# Patient Record
Sex: Female | Born: 1935 | Race: White | Hispanic: No | Smoking: Never smoker
Health system: Southern US, Community
[De-identification: ages and names within clinical notes are randomized; demographics above are authoritative.]

## PROBLEM LIST (undated history)

## (undated) DIAGNOSIS — M1990 Unspecified osteoarthritis, unspecified site: Secondary | ICD-10-CM

## (undated) DIAGNOSIS — I639 Cerebral infarction, unspecified: Secondary | ICD-10-CM

## (undated) HISTORY — PX: BACK SURGERY: SHX140

## (undated) HISTORY — PX: MASTOIDECTOMY: SUR855

## (undated) HISTORY — PX: CATARACT EXTRACTION BILATERAL W/ ANTERIOR VITRECTOMY: SHX1304

## (undated) HISTORY — PX: ABDOMINAL HYSTERECTOMY: SHX81

## (undated) HISTORY — PX: VARICOSE VEIN SURGERY: SHX832

## (undated) HISTORY — PX: FRACTURE SURGERY: SHX138

## (undated) HISTORY — PX: KNEE SURGERY: SHX244

## (undated) HISTORY — PX: NECK DISSECTION: SUR422

---

## 2001-06-26 ENCOUNTER — Encounter: Payer: Self-pay | Admitting: Neurosurgery

## 2001-07-01 ENCOUNTER — Inpatient Hospital Stay (HOSPITAL_COMMUNITY): Admission: RE | Admit: 2001-07-01 | Discharge: 2001-07-05 | Payer: Self-pay | Admitting: Neurosurgery

## 2001-07-01 ENCOUNTER — Encounter: Payer: Self-pay | Admitting: Neurosurgery

## 2001-07-02 ENCOUNTER — Encounter: Payer: Self-pay | Admitting: Neurosurgery

## 2005-04-03 ENCOUNTER — Ambulatory Visit: Payer: Self-pay | Admitting: Unknown Physician Specialty

## 2006-09-03 ENCOUNTER — Ambulatory Visit: Payer: Self-pay | Admitting: Family Medicine

## 2006-09-10 ENCOUNTER — Encounter: Payer: Self-pay | Admitting: Otolaryngology

## 2006-09-28 ENCOUNTER — Encounter: Payer: Self-pay | Admitting: Otolaryngology

## 2006-12-18 ENCOUNTER — Ambulatory Visit: Payer: Self-pay | Admitting: Otolaryngology

## 2006-12-27 ENCOUNTER — Emergency Department: Payer: Self-pay | Admitting: Emergency Medicine

## 2006-12-27 ENCOUNTER — Other Ambulatory Visit: Payer: Self-pay

## 2007-04-16 ENCOUNTER — Ambulatory Visit: Payer: Self-pay | Admitting: Gastroenterology

## 2008-02-24 ENCOUNTER — Ambulatory Visit: Payer: Self-pay

## 2008-03-18 ENCOUNTER — Other Ambulatory Visit: Payer: Self-pay

## 2008-03-18 ENCOUNTER — Ambulatory Visit: Payer: Self-pay | Admitting: Specialist

## 2008-03-25 ENCOUNTER — Ambulatory Visit: Payer: Self-pay | Admitting: Specialist

## 2010-02-01 ENCOUNTER — Ambulatory Visit: Payer: Self-pay | Admitting: Family Medicine

## 2010-08-14 ENCOUNTER — Emergency Department: Payer: Self-pay | Admitting: Emergency Medicine

## 2011-09-16 ENCOUNTER — Observation Stay: Payer: Self-pay | Admitting: Internal Medicine

## 2011-09-17 DIAGNOSIS — R079 Chest pain, unspecified: Secondary | ICD-10-CM

## 2013-03-17 ENCOUNTER — Emergency Department: Payer: Self-pay | Admitting: Unknown Physician Specialty

## 2013-04-14 ENCOUNTER — Ambulatory Visit: Payer: Self-pay | Admitting: Family Medicine

## 2013-07-11 ENCOUNTER — Emergency Department: Payer: Self-pay | Admitting: Emergency Medicine

## 2013-07-24 ENCOUNTER — Ambulatory Visit: Payer: Self-pay | Admitting: Specialist

## 2014-06-01 LAB — BASIC METABOLIC PANEL
Anion Gap: 8 (ref 7–16)
BUN: 10 mg/dL (ref 7–18)
Calcium, Total: 8.8 mg/dL (ref 8.5–10.1)
Chloride: 101 mmol/L (ref 98–107)
Co2: 27 mmol/L (ref 21–32)
Creatinine: 1.07 mg/dL (ref 0.60–1.30)
EGFR (African American): 51 — ABNORMAL LOW
EGFR (Non-African Amer.): 44 — ABNORMAL LOW
Glucose: 119 mg/dL — ABNORMAL HIGH (ref 65–99)
Osmolality: 272 (ref 275–301)
Potassium: 3.9 mmol/L (ref 3.5–5.1)
Sodium: 136 mmol/L (ref 136–145)

## 2014-06-01 LAB — CBC
HCT: 38.5 % (ref 35.0–47.0)
HGB: 12.9 g/dL (ref 12.0–16.0)
MCH: 31.9 pg (ref 26.0–34.0)
MCHC: 33.7 g/dL (ref 32.0–36.0)
MCV: 95 fL (ref 80–100)
Platelet: 296 10*3/uL (ref 150–440)
RBC: 4.05 10*6/uL (ref 3.80–5.20)
RDW: 14.1 % (ref 11.5–14.5)
WBC: 23 10*3/uL — ABNORMAL HIGH (ref 3.6–11.0)

## 2014-06-01 LAB — TROPONIN I: Troponin-I: <0.02 ng/mL

## 2014-06-02 ENCOUNTER — Inpatient Hospital Stay: Payer: Self-pay | Admitting: Internal Medicine

## 2014-06-02 LAB — URINALYSIS, COMPLETE
Bilirubin,UR: NEGATIVE
Blood: NEGATIVE
Glucose,UR: NEGATIVE mg/dL (ref 0–75)
Hyaline Cast: 2
Ketone: NEGATIVE
Leukocyte Esterase: NEGATIVE
Nitrite: NEGATIVE
Ph: 5 (ref 4.5–8.0)
Protein: NEGATIVE
RBC,UR: 2 /HPF (ref 0–5)
Specific Gravity: 1.01 (ref 1.003–1.030)
Squamous Epithelial: <1
WBC UR: <1 /HPF (ref 0–5)

## 2014-06-02 LAB — HEMOGLOBIN: HGB: 12.2 g/dL (ref 12.0–16.0)

## 2014-06-02 LAB — BASIC METABOLIC PANEL
Anion Gap: 6 — ABNORMAL LOW (ref 7–16)
BUN: 10 mg/dL (ref 7–18)
Calcium, Total: 8.6 mg/dL (ref 8.5–10.1)
Chloride: 102 mmol/L (ref 98–107)
Co2: 28 mmol/L (ref 21–32)
Creatinine: 1.08 mg/dL (ref 0.60–1.30)
EGFR (African American): 51 — ABNORMAL LOW
EGFR (Non-African Amer.): 44 — ABNORMAL LOW
Glucose: 158 mg/dL — ABNORMAL HIGH (ref 65–99)
Osmolality: 274 (ref 275–301)
Potassium: 4.1 mmol/L (ref 3.5–5.1)
Sodium: 136 mmol/L (ref 136–145)

## 2014-06-03 LAB — BASIC METABOLIC PANEL
Anion Gap: 6 — ABNORMAL LOW (ref 7–16)
BUN: 13 mg/dL (ref 7–18)
Calcium, Total: 8.4 mg/dL — ABNORMAL LOW (ref 8.5–10.1)
Chloride: 99 mmol/L (ref 98–107)
Co2: 30 mmol/L (ref 21–32)
Creatinine: 1.28 mg/dL (ref 0.60–1.30)
EGFR (Non-African Amer.): 36 — ABNORMAL LOW
GFR CALC AF AMER: 41 — AB
Glucose: 113 mg/dL — ABNORMAL HIGH (ref 65–99)
Osmolality: 271 (ref 275–301)
POTASSIUM: 4.6 mmol/L (ref 3.5–5.1)
Sodium: 135 mmol/L — ABNORMAL LOW (ref 136–145)

## 2014-06-03 LAB — CBC WITH DIFFERENTIAL/PLATELET
Basophil #: 0 10*3/uL (ref 0.0–0.1)
Basophil %: 0.2 %
Eosinophil #: 0 10*3/uL (ref 0.0–0.7)
Eosinophil %: 0.1 %
HCT: 28.4 % — ABNORMAL LOW (ref 35.0–47.0)
HGB: 9.5 g/dL — ABNORMAL LOW (ref 12.0–16.0)
Lymphocyte #: 0.9 10*3/uL — ABNORMAL LOW (ref 1.0–3.6)
Lymphocyte %: 8 %
MCH: 32.2 pg (ref 26.0–34.0)
MCHC: 33.5 g/dL (ref 32.0–36.0)
MCV: 96 fL (ref 80–100)
Monocyte #: 1.4 x10 3/mm — ABNORMAL HIGH (ref 0.2–0.9)
Monocyte %: 12.9 %
Neutrophil #: 8.5 10*3/uL — ABNORMAL HIGH (ref 1.4–6.5)
Neutrophil %: 78.8 %
Platelet: 239 10*3/uL (ref 150–440)
RBC: 2.95 10*6/uL — ABNORMAL LOW (ref 3.80–5.20)
RDW: 14.1 % (ref 11.5–14.5)
WBC: 10.7 10*3/uL (ref 3.6–11.0)

## 2014-06-04 LAB — BASIC METABOLIC PANEL
Anion Gap: 4 — ABNORMAL LOW (ref 7–16)
BUN: 18 mg/dL (ref 7–18)
CO2: 29 mmol/L (ref 21–32)
CREATININE: 1.16 mg/dL (ref 0.60–1.30)
Calcium, Total: 7.5 mg/dL — ABNORMAL LOW (ref 8.5–10.1)
Chloride: 100 mmol/L (ref 98–107)
EGFR (African American): 47 — ABNORMAL LOW
GFR CALC NON AF AMER: 40 — AB
Glucose: 87 mg/dL (ref 65–99)
Osmolality: 268 (ref 275–301)
Potassium: 4.1 mmol/L (ref 3.5–5.1)
SODIUM: 133 mmol/L — AB (ref 136–145)

## 2014-06-04 LAB — HEMOGLOBIN: HGB: 7.7 g/dL — ABNORMAL LOW (ref 12.0–16.0)

## 2014-06-05 LAB — HEMOGLOBIN: HGB: 9.3 g/dL — AB (ref 12.0–16.0)

## 2014-06-09 ENCOUNTER — Inpatient Hospital Stay: Payer: Self-pay | Admitting: Internal Medicine

## 2014-06-09 DIAGNOSIS — R0989 Other specified symptoms and signs involving the circulatory and respiratory systems: Secondary | ICD-10-CM

## 2014-06-09 DIAGNOSIS — R5383 Other fatigue: Secondary | ICD-10-CM

## 2014-06-09 DIAGNOSIS — R000 Tachycardia, unspecified: Secondary | ICD-10-CM

## 2014-06-09 DIAGNOSIS — R0609 Other forms of dyspnea: Secondary | ICD-10-CM

## 2014-06-09 DIAGNOSIS — R5381 Other malaise: Secondary | ICD-10-CM

## 2014-06-09 LAB — PROTIME-INR
INR: 1.1
PROTHROMBIN TIME: 14 s (ref 11.5–14.7)

## 2014-06-09 LAB — COMPREHENSIVE METABOLIC PANEL
ALBUMIN: 2.2 g/dL — AB (ref 3.4–5.0)
ANION GAP: 7 (ref 7–16)
AST: 46 U/L — AB (ref 15–37)
Alkaline Phosphatase: 97 U/L
BUN: 16 mg/dL (ref 7–18)
Bilirubin,Total: 1.1 mg/dL — ABNORMAL HIGH (ref 0.2–1.0)
CALCIUM: 8.1 mg/dL — AB (ref 8.5–10.1)
CHLORIDE: 99 mmol/L (ref 98–107)
CO2: 28 mmol/L (ref 21–32)
Creatinine: 0.8 mg/dL (ref 0.60–1.30)
EGFR (African American): >60
EGFR (Non-African Amer.): >60
Glucose: 97 mg/dL (ref 65–99)
Osmolality: 269 (ref 275–301)
Potassium: 4.4 mmol/L (ref 3.5–5.1)
SGPT (ALT): 15 U/L
Sodium: 134 mmol/L — ABNORMAL LOW (ref 136–145)
TOTAL PROTEIN: 6.1 g/dL — AB (ref 6.4–8.2)

## 2014-06-09 LAB — CBC
HCT: 34.6 % — AB (ref 35.0–47.0)
HGB: 11.5 g/dL — AB (ref 12.0–16.0)
MCH: 32.6 pg (ref 26.0–34.0)
MCHC: 33.3 g/dL (ref 32.0–36.0)
MCV: 98 fL (ref 80–100)
Platelet: 430 10*3/uL (ref 150–440)
RBC: 3.55 10*6/uL — AB (ref 3.80–5.20)
RDW: 14.7 % — ABNORMAL HIGH (ref 11.5–14.5)
WBC: 10.5 10*3/uL (ref 3.6–11.0)

## 2014-06-09 LAB — CK TOTAL AND CKMB (NOT AT ARMC)
CK, Total: 116 U/L
CK-MB: 0.7 ng/mL (ref 0.5–3.6)

## 2014-06-09 LAB — APTT: Activated PTT: 28.1 secs (ref 23.6–35.9)

## 2014-06-09 LAB — TROPONIN I

## 2014-06-09 LAB — TSH: Thyroid Stimulating Horm: 1.59 u[IU]/mL

## 2014-06-10 LAB — CBC WITH DIFFERENTIAL/PLATELET
Basophil #: 0.1 10*3/uL (ref 0.0–0.1)
Basophil %: 0.5 %
EOS ABS: 0.1 10*3/uL (ref 0.0–0.7)
Eosinophil %: 0.7 %
HCT: 29.5 % — AB (ref 35.0–47.0)
HGB: 9.7 g/dL — ABNORMAL LOW (ref 12.0–16.0)
Lymphocyte #: 1.1 10*3/uL (ref 1.0–3.6)
Lymphocyte %: 8.4 %
MCH: 32.3 pg (ref 26.0–34.0)
MCHC: 32.9 g/dL (ref 32.0–36.0)
MCV: 98 fL (ref 80–100)
Monocyte #: 1.3 x10 3/mm — ABNORMAL HIGH (ref 0.2–0.9)
Monocyte %: 9.8 %
NEUTROS ABS: 10.3 10*3/uL — AB (ref 1.4–6.5)
NEUTROS PCT: 80.6 %
PLATELETS: 411 10*3/uL (ref 150–440)
RBC: 3.01 10*6/uL — ABNORMAL LOW (ref 3.80–5.20)
RDW: 14.5 % (ref 11.5–14.5)
WBC: 12.8 10*3/uL — ABNORMAL HIGH (ref 3.6–11.0)

## 2014-06-10 LAB — HEPARIN LEVEL (UNFRACTIONATED)
ANTI-XA(UNFRACTIONATED): 0.79 [IU]/mL — AB (ref 0.30–0.70)
Anti-Xa(Unfractionated): 0.56 IU/mL (ref 0.30–0.70)

## 2014-06-11 LAB — CBC WITH DIFFERENTIAL/PLATELET
BASOS PCT: 0.6 %
Basophil #: 0.1 10*3/uL (ref 0.0–0.1)
Eosinophil #: 0.3 10*3/uL (ref 0.0–0.7)
Eosinophil %: 2.5 %
HCT: 30.1 % — ABNORMAL LOW (ref 35.0–47.0)
HGB: 9.8 g/dL — ABNORMAL LOW (ref 12.0–16.0)
LYMPHS ABS: 2 10*3/uL (ref 1.0–3.6)
Lymphocyte %: 18.3 %
MCH: 32.1 pg (ref 26.0–34.0)
MCHC: 32.5 g/dL (ref 32.0–36.0)
MCV: 99 fL (ref 80–100)
MONO ABS: 1.1 x10 3/mm — AB (ref 0.2–0.9)
Monocyte %: 10.5 %
NEUTROS ABS: 7.3 10*3/uL — AB (ref 1.4–6.5)
Neutrophil %: 68.1 %
PLATELETS: 440 10*3/uL (ref 150–440)
RBC: 3.04 10*6/uL — AB (ref 3.80–5.20)
RDW: 14.5 % (ref 11.5–14.5)
WBC: 10.7 10*3/uL (ref 3.6–11.0)

## 2014-06-11 LAB — HEPARIN LEVEL (UNFRACTIONATED): ANTI-XA(UNFRACTIONATED): 0.82 [IU]/mL — AB (ref 0.30–0.70)

## 2014-06-14 DIAGNOSIS — I471 Supraventricular tachycardia: Secondary | ICD-10-CM

## 2014-06-14 DIAGNOSIS — F068 Other specified mental disorders due to known physiological condition: Secondary | ICD-10-CM

## 2014-06-14 DIAGNOSIS — I2699 Other pulmonary embolism without acute cor pulmonale: Secondary | ICD-10-CM

## 2014-06-14 DIAGNOSIS — K219 Gastro-esophageal reflux disease without esophagitis: Secondary | ICD-10-CM

## 2014-06-14 DIAGNOSIS — S7290XA Unspecified fracture of unspecified femur, initial encounter for closed fracture: Secondary | ICD-10-CM

## 2014-07-26 DIAGNOSIS — F068 Other specified mental disorders due to known physiological condition: Secondary | ICD-10-CM

## 2014-07-26 DIAGNOSIS — I2699 Other pulmonary embolism without acute cor pulmonale: Secondary | ICD-10-CM

## 2014-07-26 DIAGNOSIS — K219 Gastro-esophageal reflux disease without esophagitis: Secondary | ICD-10-CM

## 2014-07-26 DIAGNOSIS — S7290XA Unspecified fracture of unspecified femur, initial encounter for closed fracture: Secondary | ICD-10-CM

## 2014-07-26 DIAGNOSIS — I491 Atrial premature depolarization: Secondary | ICD-10-CM

## 2014-08-09 ENCOUNTER — Other Ambulatory Visit: Payer: Self-pay | Admitting: Internal Medicine

## 2014-08-09 ENCOUNTER — Telehealth: Payer: Self-pay

## 2014-08-09 NOTE — Telephone Encounter (Signed)
Dawn House states pt has appt tomorrow and declines Rx at this time

## 2014-08-09 NOTE — Telephone Encounter (Signed)
Pt was discharged from [facility] 08/05/14 and pts daughter Bonita QuinLInda request refills on eloquis,flector pain patch. Bonita QuinLinda thought Dr Alphonsus SiasLetvak would refill. Should Linda contact Dr Lacie ScottsNiemeyer as PCP or can meds be refilled. Bonita QuinLinda said needs refills now out of flector patches. Linda request cb. [facility].

## 2014-08-09 NOTE — Telephone Encounter (Signed)
Spoke to ComoLinda and she states she would call Dr Carron BrazenNiemeyer's office first to see if they could do both--if not call back her for the Flector patch

## 2014-08-09 NOTE — Telephone Encounter (Signed)
Will refill flector patch, does she have enough eloquis to contact Dr. Jerral RalphNeimyer?

## 2014-08-14 ENCOUNTER — Ambulatory Visit: Payer: Self-pay | Admitting: Orthopedic Surgery

## 2014-11-03 ENCOUNTER — Ambulatory Visit: Payer: Self-pay | Admitting: Internal Medicine

## 2015-02-19 NOTE — Consult Note (Signed)
Brief Consult Note: Diagnosis: right intertrochanteric hip fracture.   Comments: plan ORIF tomorrow.  Electronic Signatures: Leitha SchullerMenz, Nastasia Kage J (MD)  (Signed 04-Aug-15 23:19)  Authored: Brief Consult Note   Last Updated: 04-Aug-15 23:19 by Leitha SchullerMenz, Samyiah Halvorsen J (MD)

## 2015-02-19 NOTE — Consult Note (Signed)
Brief Consult Note: Diagnosis: severe compression fracture.   Patient was seen by consultant.   Orders entered.   Comments: ok to do PT, is limited will continue to follow.  Electronic Signatures: Leitha SchullerMenz, Lianah Peed J (MD)  (Signed 13-Aug-15 18:55)  Authored: Brief Consult Note   Last Updated: 13-Aug-15 18:55 by Leitha SchullerMenz, Magnus Crescenzo J (MD)

## 2015-02-19 NOTE — Consult Note (Signed)
PATIENT NAME:  Dawn House, Dawn House MR#:  [id number] DATE OF BIRTH:  1917/11/23  DATE OF CONSULTATION:  06/02/2014  CONSULTING PHYSICIAN:  Leitha SchullerMichael J. Levi Klaiber, MD.  REASON FOR CONSULTATION:  Right hip fracture.   HISTORY OF PRESENT ILLNESS: The patient is a 81 year old household ambulator who suffered a fall last night. Denies loss of consciousness. She was seen and evaluated in the Emergency Room.  She had a CT of the head as well. She lives at home with 24-hour assistance. She had a CT of her head and neck that were normal. She has a right rib fracture as well as the hip fracture. She is being admitted for treatment of these problems. She denies prodromal hip symptoms.   PHYSICAL EXAMINATION: The right leg is shortened, externally rotated. She does have sensation to the foot and has a palpable dorsalis pedis pulse. Skin is intact around the hip. X-rays reveal a comminuted, unstable intertrochanteric hip fracture.   CLINICAL IMPRESSION: Unstable intertrochanteric hip fracture.   PLAN: Open reduction and internal fixation with cephalomedullary nail. Risks, benefits and possible complications were discussed with the family.  We will give preop antibiotics. Additionally, she will need postoperative anticoagulation for DVT prophylaxis. Plan on surgery at some point today when the OR is available.    ____________________________ Leitha SchullerMichael J. Anja Neuzil, MD mjm:lt D: 06/02/2014 06:56:40 ET T: 06/02/2014 07:05:57 ET JOB#: [id number]  cc: Leitha SchullerMichael J. Tanish Prien, MD, <Dictator> Leitha SchullerMICHAEL J Gael Delude MD ELECTRONICALLY SIGNED 06/02/2014 13:52

## 2015-02-19 NOTE — Discharge Summary (Signed)
Dates of Admission and Diagnosis:  Date of Admission 09-Jun-2014   Date of Discharge 01-Jan-0001   Admitting Diagnosis SVT--due to PE.   Final Diagnosis Pulmonary embolus- recent Hip sx Subacute vertebral compression fracture SVT- due to PE- now stable. Generalized weakness- due to old age and fractures.    Chief Complaint/History of Present Illness A 81 year old Caucasian female patient with a history of TIAs, multiple falls, recent right hip fracture surgery, who presented from skilled nursing facility after she complained of some shortness of breath and had tachycardia. She was found to have SVT and was brought to the Emergency Room here. She converted to normal sinus rhythm without any treatment. A CT of the chest was done, which showed right lower lobe pulmonary embolism, acute. But also the patient has an L1 fracture, which is acute with retropulsion.   Presently, the patient feels well, in normal sinus rhythm. She does have significant dementia and is a poor historian. Very pleasant.   The case was discussed with orthopedic???s Dr. Rudene Christians regarding her spinal fracture prior to admission.   Allergies:  No Known Allergies:   Thyroid:  12-Aug-15 11:17   Thyroid Stimulating Hormone 1.59 (0.45-4.50 (International Unit)  ----------------------- Pregnant patients have  different reference  ranges for TSH:  - - - - - - - - - -  Pregnant, first trimetser:  0.36 - 2.50 uIU/mL)  Hepatic:  12-Aug-15 11:17   Bilirubin, Total  1.1  Alkaline Phosphatase 97 (46-116 NOTE: New Reference Range 05/18/14)  SGPT (ALT) 15 (14-63 NOTE: New Reference Range 05/18/14)  SGOT (AST)  46  Total Protein, Serum  6.1  Albumin, Serum  2.2  Routine Chem:  12-Aug-15 11:17   Result Comment PT/PTT - CTJ TO [facility]ATCH @ 1140 ON 06-09-14,  - TUBE REC'D IS HEMOLYZED AND OVERFILLED,  - REQUESTED RECOLLECT.  Result(s) reported on 09 Jun 2014 at 11:46AM.  Glucose, Serum 97  BUN 16  Creatinine (comp)  0.80  Sodium, Serum  134  Potassium, Serum 4.4  Chloride, Serum 99  CO2, Serum 28  Calcium (Total), Serum  8.1  Osmolality (calc) 269  eGFR (African American) >60  eGFR (Non-African American) >60 (eGFR values <61mL/min/1.73 m2 may be an indication of chronic kidney disease (CKD). Calculated eGFR is useful in patients with stable renal function. The eGFR calculation will not be reliable in acutely ill patients when serum creatinine is changing rapidly. It is not useful in  patients on dialysis. The eGFR calculation may not be applicable to patients at the low and high extremes of body sizes, pregnant women, and vegetarians.)  Anion Gap 7  Cardiac:  12-Aug-15 11:17   Troponin I < 0.02 (0.00-0.05 0.05 ng/mL or less: NEGATIVE  Repeat testing in 3-6 hrs  if clinically indicated. >0.05 ng/mL: POTENTIAL  MYOCARDIAL INJURY. Repeat  testing in 3-6 hrs if  clinically indicated. NOTE: An increase or decrease  of 30% or more on serial  testing suggests a  clinically important change)  CK, Total 116 (26-192 NOTE: NEW REFERENCE RANGE  11/30/2013)  CPK-MB, Serum 0.7 (Result(s) reported on 09 Jun 2014 at 12:48PM.)  Routine Coag:  12-Aug-15 11:17   Activated PTT (APTT) - (A HCT value >55% may artifactually increase the APTT. In one study, the increase was an average of 19%. Reference: "Effect on Routine and Special Coagulation Testing Values of Citrate Anticoagulant Adjustment in Patients with High HCT Values." American Journal of Clinical Pathology 2006;126:400-405.)  Prothrombin -  INR - (INR reference interval  applies to patients on anticoagulant therapy. A single INR therapeutic range for coumarins is not optimal for all indications; however, the suggested range for most indications is 2.0 - 3.0. Exceptions to the INR Reference Range may include: Prosthetic heart valves, acute myocardial infarction, prevention of myocardial infarction, and combinations of aspirin and  anticoagulant. The need for a higher or lower target INR must be assessed individually. Reference: The Pharmacology and Management of the Vitamin K  antagonists: the seventh ACCP Conference on Antithrombotic and Thrombolytic Therapy. UUVOZ.3664 Sept:126 (3suppl): [id number]. A HCT value >55% may artifactually increase the PT.  In one study,  the increase was an average of 25%. Reference:  "Effect on Routine and Special Coagulation Testing Values of Citrate Anticoagulant Adjustment in Patients with High HCT Values." American Journal of Clinical Pathology 2006;126:400-405.)  Routine Hem:  12-Aug-15 11:17   WBC (CBC) 10.5  RBC (CBC)  3.55  Hemoglobin (CBC)  11.5  Hematocrit (CBC)  34.6  Platelet Count (CBC) 430 (Result(s) reported on 09 Jun 2014 at 11:49AM.)  MCV 98  MCH 32.6  MCHC 33.3  RDW  14.7  13-Aug-15 00:04   WBC (CBC)  12.8  RBC (CBC)  3.01  Hemoglobin (CBC)  9.7  Hematocrit (CBC)  29.5  Platelet Count (CBC) 411  MCV 98  MCH 32.3  MCHC 32.9  RDW 14.5  Neutrophil % 80.6  Lymphocyte % 8.4  Monocyte % 9.8  Eosinophil % 0.7  Basophil % 0.5  Neutrophil #  10.3  Lymphocyte # 1.1  Monocyte #  1.3  Eosinophil # 0.1  Basophil # 0.1 (Result(s) reported on 10 Jun 2014 at 12:34AM.)  14-Aug-15 04:24   WBC (CBC) 10.7  RBC (CBC)  3.04  Hemoglobin (CBC)  9.8  Hematocrit (CBC)  30.1  Platelet Count (CBC) 440  MCV 99  MCH 32.1  MCHC 32.5  RDW 14.5  Neutrophil % 68.1  Lymphocyte % 18.3  Monocyte % 10.5  Eosinophil % 2.5  Basophil % 0.6  Neutrophil #  7.3  Lymphocyte # 2.0  Monocyte #  1.1  Eosinophil # 0.3  Basophil # 0.1 (Result(s) reported on 11 Jun 2014 at 05:01AM.)   PERTINENT RADIOLOGY STUDIES: LabUnknown:    12-Aug-15 13:23, CT ANGIOGRAPHY Chest with for PE  PACS Image   CT:    12-Aug-15 14:32, CT Lumbar Spine Without Contrast  CT Lumbar Spine Without Contrast   REASON FOR EXAM:    compression fracture possible  COMMENTS:       PROCEDURE: CT  - CT LUMBAR  SPINE WO  - Jun 09 2014  2:32PM     CLINICAL DATA:  Compression fracture.    EXAM:  CT LUMBAR SPINE WITHOUT CONTRAST    TECHNIQUE:  Multidetector CT imagingof the lumbar spine was performed without  intravenous contrast administration. Multiplanar CT image  reconstructions were also generated.  COMPARISON:  CTA chest from the same day.    FINDINGS:  A compression burst fracture is present at T12. There is significant  retropulsion of bone. This narrows the spinal canal to 7.5 mm in the  midline. There is significant retropulsion of bone, displaced at  least 9 mm. This results an moderate foraminal stenosis bilaterally  at T12-L1, left greater than right.    The slight depression of the left superior endplate of L1 may be  acute is well. There is a bone fragment from the T12 fracture which  appears to impact on the superior endplate.    Significant leftward  curvature of the lumbar spine is centered at  L2. There is confirmed STIR rightward curvature in the lower lumbar  spine.    A remote superior endplate fracture is present at L2. Chronic  endplate changes are noted on the right at L2-3 with a vacuum disc.    Contrast is noted within the kidneys bilaterally. Atherosclerotic  changes are present in the aorta without aneurysm.    L1-2: A broad-based disc protrusion and facet hypertrophy lead to  mild subarticular narrowing bilaterally. Mild foraminal narrowing is  present bilaterally as well.    L2-3: A broad-based disc protrusion is asymmetric to the right.  Facet hypertrophy is noted bilaterally. Moderate right subarticular  and foraminal stenosis is present.  L3-4: A broad-based disc protrusion is present. Mild subarticular  narrowing is present on the left. The foramina are patent.    L4-5: A broad-based disc protrusion is present. Ligamentum flavum  thickening is noted. Mild subarticular narrowing is present  bilaterally. The foramina are patent.    L5-S1:  Asymmetric left-sided facet hypertrophy is present. There is  no significant stenosis.     IMPRESSION:  1. Compression burst fracture at T12 with 9 mm retropulsion of bone  narrowing the spinal canal to 7.5 mm in the midline. This likely  impacts spinal cord.  2. Bilateral foraminal stenosis at T12-L1 is worse on the left.  3. Left superior endplate fracture at L1 with minimal depression  appears acute. There is a bone fragment from the T12 fracture which  is impacting on the endplate.  4. Remote superior endplate fracture at L2.  5. Levoconvex scoliosis of the lumbar spine is centered at L2-3.  6. Mild spondylosis of the remainder of the lumbar spine as  described.      Electronically Signed    By: Lawrence Santiago M.D.    On: 06/09/2014 20:01         Verified By: Resa Miner. MATTERN, M.D.,   Pertinent Past History:  Pertinent Past History 1.  GERD.  2.  Hysterectomy.  3.  TIA.  4.  Bilateral cataract surgeries.  5.  Laser treatment of eye.  6.  Neck surgery. 7.  Fracture, wrist, right and left.  8.  Recurrent falls.   Hospital Course:  Hospital Course * pulmonary embolus   recent Hip sx. was on lovenox- still developed PE.   Explained risk of antocoagulant to grand daughter and friend in room. spoke to pt's daughter Vaughan Basta on phone.   was On heparin drip.   Hb stable- start eliquis- adjusted dose per age and weight by pharmacy- need for 6 mths- if no any bleed or falls in future.  * Recent hip fracture- generalized weakness   need rehab placement again.  * vertebrl fracture- sub acute?   Appreciated ortho consult- continue PT.  * Supraventricular tachycardia. secondary to her pulmonary embolism.   on some intravenous fluids , on low-dose Toprol-XL 25 mg daily. Troponin has been normal.   * Constipation. on laxatives and MiraLax. d/c to SNF today.   Condition on Discharge Stable   Code Status:  Code Status No Code/Do Not Resuscitate   DISCHARGE  INSTRUCTIONS HOME MEDS:  Medication Reconciliation: Patient's Home Medications at Discharge:     Medication Instructions  nexium 40 mg oral delayed release capsule  1 cap(s) orally once a day (in the evening)   vitamin b-12 1000 mcg/ml injectable solution  1 milliliter(s) injectable once a month   tylenol 325 mg oral tablet  2 tab(s) orally every 4 hours, As Needed - for Fever   milk of magnesia 8% oral suspension  30 milliliter(s) orally 2 times a day   senna s 50 mg-8.6 mg oral tablet  2 tab(s) orally once a day (in the morning)   norco 325 mg-5 mg oral tablet  0.5 tab(s) orally every 4 hours, As Needed   calcium 500+d 500 mg-400 intl units oral tablet, chewable  1 tab(s) orally 2 times a day   apixaban 2.5 mg oral tablet  1 tab(s) orally 2 times a day   metoprolol succinate 25 mg oral tablet, extended release  1 tab(s) orally once a day    STOP TAKING THE FOLLOWING MEDICATION(S):    aspirin 81 mg oral tablet, chewable: 1 tab(s) orally once a day enoxaparin 30 mg/0.3 ml injectable solution: 0.3 milliliter(s) intramuscular once a day for 14 days  Physician's Instructions:  Diet Regular   Activity Limitations As tolerated   Return to Work Not Applicable   Time frame for Follow Up Appointment 1-2 weeks  PMD   Other Comments to check CBC and BMP with PMD in 2 weeks. ortho clinic follow up in 1-2 weeks.     Hessie Knows J(Ordered): [facility], [address] [place], [place] [address], [place], [place] Physician): [facility], [address], [place], [place], [place] [address], [place] [phone]  Electronic Signatures: Vaughan Basta (MD)  (Signed 14-Aug-15 14:30)  Authored: ADMISSION DATE AND DIAGNOSIS, CHIEF COMPLAINT/HPI, Allergies, PERTINENT LABS, PERTINENT RADIOLOGY STUDIES, PERTINENT PAST [place], PATIENT INSTRUCTIONS, Follow Up Physician   Last  Updated: 14-Aug-15 14:30 by Vaughan Basta (MD)

## 2015-02-19 NOTE — H&P (Signed)
PATIENT NAME:  Dawn House, [name] MR#:  [id number] DATE OF BIRTH:  [birth year]  DATE OF ADMISSION:  06/02/2014    PRIMARY CARE PHYSICIAN: Dr. Lacie ScottsNiemeyer.   CHIEF COMPLAINT: Status post fall with right hip fracture.   HISTORY OF PRESENT ILLNESS: This is a 81 year old female with a past medical history of GERD, presents with a fall. The patient lives at home, with 24/7 hour care. As well, she has family members at home all the time to help her take care of her, the patient ambulates at baseline with a walker, which is known to have history of multiple falls in the past, with most recent in 2014, where she had wrist fracture of left and right on different occasions secondary to falls. The patient had a fall today, she ambulates at baseline with a walker, the patient was walking today without a walker. Her daughter is at the bedside. She assists with the history. Reports her mom walked without assistance of walker while she was going to open the door, where the patient reports she just fell backwards while she was trying to open the door. She denies any loss of consciousness, dizziness, altered mental status. The patient complains then of right hip pain. The patient in the ED had basic evaluation, including CT head and cervical spine, right hip x-ray as well, had chest x-ray. Findings did show evidence of right hip fracture. As well, of right ninth rib fracture as well. The patient had leukocytosis at 23,000. She denies any fever, any chills, but daughter reports her mom was recently treated with Cipro for UTI for a total of 7 days, but she did finish the antibiotic over the last week. The patient denies any complaints. No dysuria, no polyuria. Urinalysis is still pending. The patient denies cough, productive sputum, shortness of breath or chest pain.   PAST MEDICAL HISTORY:  1. Gastroesophageal reflux disease. 2. Hysterectomy.  3. History of transient ischemic attack.  4. Cataract surgery bilaterally.  5.  Laser treatment of eye.  6. Neck surgery.  7. Fractured wrist right and left.  8. History of multiple falls in the past.   HOME MEDICATIONS:  1. B12 injection once a month.  2. Aspirin 81 mg daily.  3. Pantoprazole 20 mg daily.   ALLERGIES: No known drug allergies.   SOCIAL HISTORY: The patient lives alone, but has 24/7 care. She ambulates with a walker. No tobacco. No alcohol. No history of drug use.   FAMILY HISTORY: Negative for diabetes, CVA or heart disease.   REVIEW OF SYSTEMS:  CONSTITUTIONAL: The patient denies fever, chills, fatigue, weakness.  EYES: Denies blurred vision, double vision. Reports history of cataract surgery.  HEENT: Denies any hearing loss, dysphagia, sore throat.  RESPIRATORY: Denies any shortness of breath, any hemoptysis, any productive sputum.  CARDIOVASCULAR: Denies any chest pain, palpitation, syncope.  GASTROINTESTINAL: Denies nausea, vomiting, diarrhea, abdominal pain, hematemesis.  GENITOURINARY: Denies dysuria, hematuria, renal colic.  ENDOCRINE: Denies polyuria or polydipsia, heat or cold intolerance.  HEMATOLOGIC: Denies anemia, easy bruising, bleeding diathesis.  INTEGUMENT: Denies acne, rash, or skin lesion.  MUSCULOSKELETAL: Complains of right hip pain secondary to fall.  NEUROLOGIC: Denies any focal deficits, tingling, numbness, vertigo, dizziness.  PSYCHIATRIC: Denies anxiety, insomnia, or depression.   PHYSICAL EXAMINATION:  VITAL SIGNS: Temperature 97.8, pulse 73, respiratory rate 18, blood pressure 142/72, saturating 95% on room air.  GENERAL: Frail, elderly female who looks comfortable in bed, in no apparent distress.  HEENT: Head atraumatic, normocephalic has very minimal  scratch in the right eyebrow. Pupils equal, reactive to light. Pink conjunctivae. Anicteric sclerae. Moist oral mucosa.  NECK: Supple. No thyromegaly. No JVD.  CHEST: Good air entry bilaterally. No wheezing, rales or rhonchi. No tenderness to palpation.   CARDIOVASCULAR: S1, S2 heard. No rubs, murmur or gallops.  ABDOMEN: Soft, nontender, nondistended. Bowel sounds present.  EXTREMITIES: No edema. No clubbing. No cyanosis. Pedal pulses +2 bilaterally. Right lower extremity is extremely shorter than the left. MUSCULOSKELETAL: Complains of right hip pain to mild palpation.  SKIN: Normal skin turgor. Warm and dry.  PSYCHIATRIC: The patient is pleasant, awake, alert, communicative,  NEUROLOGIC: Cranial nerves grossly intact. Strength 5/5. No focal deficits.   PERTINENT LABORATORY DATA: Glucose 119, BUN 10, creatinine 1.07, sodium 136, potassium 3.9, chloride 101, CO2 27. Troponin less than 0.02. White blood cell 23, hemoglobin 12.9, hematocrit 38.5, platelets 296,000.   EKG normal sinus rhythm without significant ST or T wave abnormalities.   ASSESSMENT AND PLAN: 1. Right hip fracture. The patient denies any chest pain, any shortness of breath, has normal EKG. Blood work-up is only significant for leukocytosis. No significant electrolyte abnormalities. The patient is moderate risk for surgery given her advanced age. No further  work-up is indicated at this point prior to surgery.  2. Leukocytosis most likely related to stress from her fall, but we will check urinalysis. Chest x-ray does not show any opacity or infiltrate.  3. Gastroesophageal reflux disease. Continue with proton pump inhibitor.  4. Deep vein thrombosis prophylaxis. Sequential compression device and TED hose. We will resume back on chemical anticoagulation after surgery.   TIME SPENT ON ADMISSION AND PATIENT CARE: 50 minutes.   CODE STATUS: Full code. Daughter is her healthcare power of attorney.     ____________________________ Starleen Arms, MD dse:jh D: 06/02/2014 01:42:16 ET T: 06/02/2014 02:34:57 ET JOB#: [id number]  cc: Starleen Arms, MD, <Dictator> Wrenn Willcox Teena Irani MD ELECTRONICALLY SIGNED 06/03/2014 0:51

## 2015-02-19 NOTE — H&P (Signed)
PATIENT NAME:  Melynda KellerRILEY, Suesan G MR#:  [id number] DATE OF BIRTH:  June 10, 1918  DATE OF ADMISSION:  06/09/2014  CHIEF COMPLAINT: Shortness of breath, tachycardia.   HISTORY OF PRESENT ILLNESS:  A 81 year old Caucasian female patient with a history of TIAs, multiple falls, recent right hip fracture surgery, who presented from skilled nursing facility after she complained of some shortness of breath and had tachycardia. She was found to have SVT and was brought to the Emergency Room here. She converted to normal sinus rhythm without any treatment. A CT of the chest was done, which showed right lower lobe pulmonary embolism, acute. But also the patient has an L1 fracture, which is acute with retropulsion.   Presently, the patient feels well, in normal sinus rhythm. She does have significant dementia and is a poor historian. Very pleasant.   The case was discussed with orthopedic's Dr. Rosita KeaMenz regarding her spinal fracture prior to admission.   PAST MEDICAL HISTORY:  1.  GERD.  2.  Hysterectomy.  3.  TIA.  4.  Bilateral cataract surgeries.  5.  Laser treatment of eye.  6.  Neck surgery. 7.  Fracture, wrist, right and left.  8.  Recurrent falls.   HOME MEDICATIONS:  1.  Aspirin 81 mg daily.  2.  Nexium 20 mg daily.  3.  Milk of Magnesia 30 mL 2 times a day.  4.  Calcium and vitamin D 1 tablet daily.  5.  Enoxaparin 30 mg subcutaneous once a day.  6.  Norco 325/5, half to 1 tablet every 4 hours as needed for pain.  7.  Senna/docusate 1 tablet 2 times a day. 8.  Tylenol 650 mg oral every 4 hours as needed for fever.  9.  Vitamin B12 injection once a month.   REVIEW OF SYSTEMS: CONSTITUTIONAL: Complains of weakness.  EYES:  No blurred vision, pain or redness.  ENT: No tinnitus, ear pain, hearing loss.  RESPIRATORY: No cough, wheeze, hemoptysis.   CARDIOVASCULAR: No chest pain.  No orthopnea, edema.  GASTROINTESTINAL: No nausea, vomiting, or hematuria.  Has constipation.  GENITOURINARY: No  incontinence.  ENDOCRINE: No polyuria, nocturia, thyroid problems.   HEMATOLOGIC/LYMPHATIC:  No anemia, easy bruising, or bleeding.  INTEGUMENTARY: No acne, no rash.  MUSCULOSKELETAL: Has back pain for 3 months. Right hip pain.  NEUROLOGIC: No focal numbness or weakness. PSYCHIATRY:  No anxiety or depression.   ALLERGIES: No known drug allergies.   SOCIAL HISTORY: The patient does not smoke. No alcohol. No illicit drugs. Presently at the nursing home, and prior to this was living at home with significant help with family and ambulated with a walker with help.  CODE STATUS: DNR/DNI.   FAMILY HISTORY: Negative for diabetes, CVA or heart disease.   PHYSICAL EXAMINATION:  VITAL SIGNS: Temperature 97.9, pulse initially of 150, currently at 78, blood pressure 125/61, saturating 97% on room air. GENERAL:  An elderly Caucasian female patient lying in bed, overall seems comfortable, conversational, cooperative with exam.  PSYCHIATRIC: Alert, not oriented to place or time. HEENT:  Atraumatic, normocephalic. Oral mucosa moist and pink. External ears and nose normal. No pallor. Pupils bilaterally equal and reactive to light.  NECK:  Supple. No thyromegaly. No palpable lymph nodes. Trachea midline. No carotid bruit, JVD.  CARDIOLOGY:  S1, S2 without any murmurs. Peripheral pulses 2+. No edema.  RESPIRATORY: Normal work of breathing. Clear to auscultation on both sides.   GASTROINTESTINAL: Soft abdomen, nontender. Bowel sounds present.  Bowel sounds present. No hepatosplenomegaly palpable.  SKIN: Warm and dry.  No petechiae, rash or ulcer.  MUSCULOSKELETAL: No joint tenderness in large joints. Has some point tenderness in the lower spine. No swelling or redness noticed.  NEUROLOGIC: Motor strength 5/5 in upper and lower extremities. Sensation is intact all over.  LYMPHATIC: No cervical lymphadenopathy.   LABORATORY STUDIES: Show glucose of 97, BUN 16, creatinine 0.80, sodium 134. AST, ALT, alkaline  phosphatase, bilirubin normal. Troponin less than 0.02.   WBC 10.5, hemoglobin 11.5 with platelets of 430,000. INR 1.1, PTT 28.1.   DIAGNOSTIC DATA:  EKG shows SVT.   CTA of the chest for PE shows acute pulmonary embolism of right middle lobe in the subsegmental branch along with very small bilateral pleural effusions.  Also has severe L1 compression fracture which is partially imaged with retropulsion. Chronic-appearing T3 compression fracture.   ASSESSMENT AND PLAN: 1.  Acute right middle lobe subsegmental pulmonary embolism. This is provoked from recent fracture.  The patient had been on prophylactic Lovenox to prevent venous thromboembolism, but unfortunately the patient presently has acute pulmonary embolism and will be admitted. The only complicating condition at this point is the subacute to acute L1 compression fracture with high risk for bleeding, but considering the risk versus benefit and discussing with family, the patient is being started on IV heparin. If there is any bleeding or worsening neurological status, the patient will need transfer to a tertiary care center with neurosurgery speciality. We will put her on neuro checks q. 4 hours on her lower extremities.  She does not have any neurological deficits at this point and will not need any surgery. The patient can be transitioned to one of the newer anticoagulants at discharge.  2.  Supraventricular tachycardia. This is secondary to her pulmonary embolism. She also seems a little dehydrated. We will put her on some intravenous fluids and start her on low-dose Toprol-XL 25 mg daily. Troponin has been normal.  3.  Constipation. Put her on laxatives and MiraLax.  4.  Deep venous thrombosis prophylaxis. The patient is on heparin drip.   I discussed the case with orthopedics, Dr. Ernest Pine and Dr. Rosita Kea, and we will consult orthopedics if the patient needs any surgical intervention.   Time spent today on this case was 60  minutes.   ____________________________ Molinda Bailiff Emy Angevine, MD srs:DT D: 06/09/2014 15:02:39 ET T: 06/09/2014 15:37:06 ET JOB#: [id number]  cc: Wardell Heath R. Lota Leamer, MD, <Dictator> Orie Fisherman MD ELECTRONICALLY SIGNED 06/14/2014 14:03

## 2015-02-19 NOTE — Discharge Summary (Signed)
PATIENT NAME:  Dawn KellerRILEY, Dawn House MR#:  [id number] DATE OF BIRTH:  1918/06/13  DATE OF ADMISSION:  06/02/2014 DATE OF DISCHARGE:  06/05/2014  PRESENTING COMPLAINT: Fall with hip pain.   DISCHARGE DIAGNOSES:  1.  Right hip fracture, status post mechanical fall, status post open reduction and internal fixation with cephalomedullary nail, right hip by Dr. Rosita KeaMenz done on 06/02/2014.  2.  Osteoporosis.  3.  GERD.  4.  CODE STATUS: FULL CODE.   DISCHARGE MEDICATIONS:  1.  Lovenox 30 mg subcutaneous daily for 14 days. 2.  Ferrous sulfate 325 mg p.o. b.i.d.  3.  Aspirin 81 mg daily.  4.  Nexium 40 mg p.o. daily.  5.  Norco 5/325 one to 2 q 4-6 p.r.n.  6.  Vitamin B12 1000 mcg IM monthly.  7.  Calcium with Vitamin D 500 mg b.i.d.  DISCHARGE INSTRUCTIONS: 1.  DIET: Mechanical soft diet, thin liquids, strict aspiration precautions. Medications in puree.  2.  Physical therapy.  3.  Follow up with Dr. Rosita KeaMenz in 2 weeks.   HOSPITAL COURSE BY ISSUE: Ms. [name] is a 81 year old Caucasian female with history of osteoporosis and PIA in the past, comes in with mechanical fall at home. She was admitted with:   1.  Right hip fracture. She is status post ORIF, POD day 3. She is doing well. Physical therapy recommends rehabilitation. She will go [facility]. Surgery was performed by Dr. Rosita KeaMenz on 08/05.  2.  GERD. Continue Nexium.   3.  History of multiple falls with the most recent fracture. The patient will be discharged to rehabilitation for physical therapy.   4.  History of transient ischemic attack. Continue aspirin.   5.  Anemia. Likely acute blood loss anemia from recent hip surgery, status post 1 unit blood transfusion. Hemoglobin at discharge is 9.3. The patient is also discharged on p.o. iron 325 mg p.o. b.i.d.   6.  History of osteoporosis. We will start the patient on calcium with vitamin D 500 mg p.o. daily.   The patient remained a FULL CODE.  TIME SPENT: 40 minutes.     ____________________________ Wylie HailSona A. Allena KatzPatel, MD sap:ts D: 06/05/2014 09:06:22 ET T: 06/05/2014 11:55:22 ET JOB#: [id number]  cc: Jesica Goheen A. Allena KatzPatel, MD, <Dictator> Willow OraSONA A Adryanna Friedt MD ELECTRONICALLY SIGNED 06/09/2014 13:50

## 2015-02-19 NOTE — Op Note (Signed)
PATIENT NAME:  Dawn KellerRILEY, Dawn G MR#:  [id number] DATE OF BIRTH:  December 16, 1917  DATE OF PROCEDURE:  06/02/2014  PREOPERATIVE  DIAGNOSIS: Comminuted, unstable right intertrochanteric hip fracture.  POSTOPERATIVE DIAGNOSIS: Comminuted, unstable right intertrochanteric hip fracture.    PROCEDURE: Open reduction and internal fixation with cephalomedullary nail, right hip.   SURGEON: Leitha SchullerMichael J. Hudsyn Champine, MD   ANESTHESIA: General.   DESCRIPTION OF PROCEDURE: The patient was brought to the operating room and, after adequate general anesthesia was obtained, the patient was transferred to the fracture table with the left leg in well-leg holder. Traction and internal rotation gave essentially anatomic reduction to the fracture. After prepping and draping, using the barrier drape method, appropriate patient identification and timeout procedures were completed. A small incision was made just proximal to the tip of the greater trochanter and a guidewire was inserted through the tip of the trochanter on the AP and center on the lateral views. Proximal reaming was carried out and a long guidewire inserted down the canal. Measurements were made off of this, and reaming to 13 mm carried out. An 11 x 380 rod was chosen and impacted to the appropriate level. With a small incision laterally, a guidewire was placed in a center-center position into the head, measured, drilled, and then the screw set, with a 90 mm lag screw, inserted. The proximal set screw was placed to allow for compression, and lateral traction was released at this point and compression applied through the insertion instruments. The insertional handle was removed, and permanent AP and lateral images were taken. Next a lateral view was taken distally and, with freehand technique, a small incision was made, drilled, and a 5.0 cortical locking screw was placed. Checking the AP, it showed that it was getting both cortices and, on the lateral view, it showed it was  going through the rod.   All wounds were then irrigated and closed with 0 Vicryl deep, 2-0 Vicryl subcutaneously and skin staples. Xeroform, 4x4's, ABD and tape applied.   The patient was sent to the recovery room stable condition.   ESTIMATED BLOOD LOSS: 100 mL  COMPLICATIONS: None.   SPECIMEN: None.   IMPLANT: Affixus 11 x 380, right, 130 degree femoral rod with 90 mm lag screw; 40 mm, 5.0 mm interlocking screw.    ____________________________ Leitha SchullerMichael J. Ladarrian Asencio, MD mjm:ms D: 06/02/2014 12:43:22 ET T: 06/02/2014 13:05:03 ET JOB#: [id number]  cc: Leitha SchullerMichael J. Hurman Ketelsen, MD, <Dictator> Leitha SchullerMICHAEL J Abella Shugart MD ELECTRONICALLY SIGNED 06/02/2014 13:52

## 2015-06-09 ENCOUNTER — Other Ambulatory Visit: Payer: Self-pay | Admitting: Family Medicine

## 2015-06-09 ENCOUNTER — Encounter: Payer: Self-pay | Admitting: Family Medicine

## 2015-06-09 ENCOUNTER — Other Ambulatory Visit: Payer: Self-pay

## 2015-06-09 ENCOUNTER — Ambulatory Visit (INDEPENDENT_AMBULATORY_CARE_PROVIDER_SITE_OTHER): Payer: Medicare Other | Admitting: Family Medicine

## 2015-06-09 VITALS — BP 140/64 | HR 72 | Ht 65.0 in | Wt 97.0 lb

## 2015-06-09 DIAGNOSIS — M5135 Other intervertebral disc degeneration, thoracolumbar region: Secondary | ICD-10-CM | POA: Diagnosis not present

## 2015-06-09 DIAGNOSIS — M21932 Unspecified acquired deformity of left forearm: Secondary | ICD-10-CM | POA: Insufficient documentation

## 2015-06-09 DIAGNOSIS — R2681 Unsteadiness on feet: Secondary | ICD-10-CM | POA: Diagnosis not present

## 2015-06-09 DIAGNOSIS — Z8781 Personal history of (healed) traumatic fracture: Secondary | ICD-10-CM

## 2015-06-09 DIAGNOSIS — G3184 Mild cognitive impairment, so stated: Secondary | ICD-10-CM

## 2015-06-09 DIAGNOSIS — M810 Age-related osteoporosis without current pathological fracture: Secondary | ICD-10-CM

## 2015-06-09 DIAGNOSIS — Z8673 Personal history of transient ischemic attack (TIA), and cerebral infarction without residual deficits: Secondary | ICD-10-CM | POA: Diagnosis not present

## 2015-06-09 DIAGNOSIS — E538 Deficiency of other specified B group vitamins: Secondary | ICD-10-CM | POA: Diagnosis not present

## 2015-06-09 DIAGNOSIS — R636 Underweight: Secondary | ICD-10-CM

## 2015-06-09 DIAGNOSIS — D638 Anemia in other chronic diseases classified elsewhere: Secondary | ICD-10-CM | POA: Insufficient documentation

## 2015-06-09 DIAGNOSIS — R195 Other fecal abnormalities: Secondary | ICD-10-CM | POA: Diagnosis not present

## 2015-06-09 DIAGNOSIS — Z23 Encounter for immunization: Secondary | ICD-10-CM | POA: Diagnosis not present

## 2015-06-09 MED ORDER — DOCUSATE SODIUM 50 MG PO CAPS: 50.0000 mg | ORAL_CAPSULE | Freq: Every day | ORAL | Status: AC | PRN

## 2015-06-09 MED ORDER — VITAMIN B-12 1000 MCG PO TABS: 1000.0000 ug | ORAL_TABLET | Freq: Every day | ORAL | Status: AC

## 2015-06-09 NOTE — Patient Instructions (Signed)
Begin vitamin B12 1000 mcg daily. Stop pantoprazole. Use stool softeners as needed.

## 2015-06-09 NOTE — Progress Notes (Signed)
Date:  06/09/2015   Name:  Dawn House   DOB:  1918/05/30   MRN:  [id number]  PCP:  Evelene Croon, MD    Chief Complaint: Establish Care   History of Present Illness:  This is a 81 y.o. female s/p hip fx 05/2014 and L wrist fx 10/2014 (not repaired) but otherwise quite healthy for age. C/o some chronic phlegm production worse in warm weather and intermittent back pain. Was placed on PPI for cough but not helping. Appetite/weight stable per daughter. Was receiving monthly B12 injections but none recent. Has chronic L wrist deformity that cause pain, daughter interested in ortho referral for second opinion re: repair. Hx TIA's years ago, none recent. Has hard stools twice weekly with some straining. UTD on tetanus (2014) and Pneumovax (2012) but not Prevnar. Memory mostly intact, occ gets confused at night, has 24 hour care.   Review of Systems:  Review of Systems  Constitutional: Negative for fever, appetite change and unexpected weight change.  HENT: Negative for ear pain, sore throat and trouble swallowing.   Eyes: Negative for pain.  Respiratory: Negative for shortness of breath.   Cardiovascular: Negative for chest pain and leg swelling.  Gastrointestinal: Negative for abdominal pain.  Endocrine: Negative for polyuria.  Genitourinary: Negative for dysuria and difficulty urinating.  Musculoskeletal: Negative for neck pain.  Skin: Negative for rash.  Neurological: Negative for tremors, seizures, syncope and speech difficulty.  Hematological: Negative for adenopathy.  Psychiatric/Behavioral: Negative for hallucinations, sleep disturbance and dysphoric mood.    Patient Active Problem List   Diagnosis Date Noted  . Osteoporosis 06/09/2015  . Anemia of chronic disease 06/09/2015  . Gait instability 06/09/2015  . Deformity of left wrist 06/09/2015  . DDD (degenerative disc disease), thoracolumbar 06/09/2015  . Mild cognitive impairment 06/09/2015  . Underweight 06/09/2015     Prior to Admission medications   Medication Sig Start Date End Date Taking? Authorizing Provider  acetaminophen (TYLENOL) 325 MG tablet Take 2 tablets by mouth every 6 (six) hours as needed.   Yes Historical Provider, MD  aspirin 81 MG tablet Take 81 mg by mouth daily.   Yes Historical Provider, MD  docusate sodium (COLACE) 50 MG capsule Take 1 capsule (50 mg total) by mouth daily as needed. 06/09/15   Schuyler Amor, MD  vitamin B-12 (CYANOCOBALAMIN) 1000 MCG tablet Take 1 tablet (1,000 mcg total) by mouth daily. 06/09/15   Schuyler Amor, MD    Allergies  Allergen Reactions  . Hydrocodone-Acetaminophen Other (See Comments)    Past Surgical History  Procedure Laterality Date  . Varicose vein surgery    . Abdominal hysterectomy    . Mastoidectomy    . Knee surgery    . Cataract extraction bilateral w/ anterior vitrectomy    . Neck dissection      Social History  Substance Use Topics  . Smoking status: Never Smoker   . Smokeless tobacco: Not on file  . Alcohol Use: No    Family History  Problem Relation Age of Onset  . Cancer Father     Medication list has been reviewed and updated.  Physical Examination: BP 140/64 mmHg  Pulse 72  Ht 5\' 5"  (1.651 m)  Wt 97 lb (43.999 kg)  BMI 16.14 kg/m2  Physical Exam  Constitutional: She appears well-developed and well-nourished. No distress.  HENT:  Right Ear: External ear normal.  Left Ear: External ear normal.  Nose: Nose normal.  Mouth/Throat: Oropharynx is clear and moist.  Eyes: Conjunctivae  and EOM are normal. Pupils are equal, round, and reactive to light.  Neck: Neck supple. No thyromegaly present.  Cardiovascular: Normal rate, regular rhythm and normal heart sounds.   Pulmonary/Chest: Effort normal and breath sounds normal.  Abdominal: Soft. She exhibits no distension and no mass. There is no tenderness.  Musculoskeletal: She exhibits no edema.  Marked L wrist deformity  Lymphadenopathy:    She has no cervical  adenopathy.  Neurological: She is alert. Coordination normal.  Skin: Skin is warm and dry. No rash noted.  Psychiatric: She has a normal mood and affect. Her behavior is normal.    Assessment and Plan:  1. Osteoporosis Check vit D, avoid falls - Vitamin D (25 hydroxy)  2. Anemia of chronic disease 9.8/30.1 in 05/2014 - CBC - Comprehensive Metabolic Panel (CMET)  3. Gait instability S/p fall with hip fx 2015, largely WC bound - TSH - Urinalysis  4. Deformity of left wrist S/p untreated fx 10/2104 - Ambulatory referral to Orthopedic Surgery  5. DDD (degenerative disc disease), thoracolumbar Tylenol prn for back pain, avoid NSAID's  6. Hx of transient ischemic attack (TIA) Continue asa  7. Hx of fracture of hip  8. Mild cognitive impairment Per daughter, not interested in further evaluation  9. Underweight  10. Hard stools with straining May use Colace prn  11. Health maintenance Prevnar given  12. B12 deficiency Convert to oral B12 replacement  Return in about 6 months (around 12/10/2015).  Dionne Ano. Kingsley Spittle MD [facility]  06/09/2015

## 2015-06-10 LAB — COMPREHENSIVE METABOLIC PANEL
ALBUMIN: 3.6 g/dL (ref 3.2–4.6)
ALT: 8 IU/L (ref 0–32)
AST: 18 IU/L (ref 0–40)
Albumin/Globulin Ratio: 1.6 (ref 1.1–2.5)
Alkaline Phosphatase: 74 IU/L (ref 39–117)
BILIRUBIN TOTAL: 0.3 mg/dL (ref 0.0–1.2)
BUN/Creatinine Ratio: 13 (ref 11–26)
BUN: 11 mg/dL (ref 10–36)
CHLORIDE: 100 mmol/L (ref 97–108)
CO2: 26 mmol/L (ref 18–29)
CREATININE: 0.84 mg/dL (ref 0.57–1.00)
Calcium: 9 mg/dL (ref 8.7–10.3)
GFR calc Af Amer: 68 mL/min/{1.73_m2} (ref >59)
GFR calc non Af Amer: 59 mL/min/{1.73_m2} — ABNORMAL LOW (ref >59)
Globulin, Total: 2.2 g/dL (ref 1.5–4.5)
Glucose: 107 mg/dL — ABNORMAL HIGH (ref 65–99)
Potassium: 4.3 mmol/L (ref 3.5–5.2)
SODIUM: 140 mmol/L (ref 134–144)
TOTAL PROTEIN: 5.8 g/dL — AB (ref 6.0–8.5)

## 2015-06-10 LAB — CBC
Hematocrit: 38.3 % (ref 34.0–46.6)
Hemoglobin: 13.2 g/dL (ref 11.1–15.9)
MCH: 32.7 pg (ref 26.6–33.0)
MCHC: 34.5 g/dL (ref 31.5–35.7)
MCV: 95 fL (ref 79–97)
PLATELETS: 342 10*3/uL (ref 150–379)
RBC: 4.04 x10E6/uL (ref 3.77–5.28)
RDW: 13.2 % (ref 12.3–15.4)
WBC: 7.9 10*3/uL (ref 3.4–10.8)

## 2015-06-10 LAB — URINALYSIS
Bilirubin, UA: NEGATIVE
GLUCOSE, UA: NEGATIVE
KETONES UA: NEGATIVE
Nitrite, UA: NEGATIVE
Protein, UA: NEGATIVE
RBC, UA: NEGATIVE
Specific Gravity, UA: 1.017 (ref 1.005–1.030)
UUROB: 1 mg/dL (ref 0.2–1.0)
pH, UA: 6 (ref 5.0–7.5)

## 2015-06-10 LAB — VITAMIN D 25 HYDROXY (VIT D DEFICIENCY, FRACTURES): VIT D 25 HYDROXY: 45.3 ng/mL (ref 30.0–100.0)

## 2015-06-10 LAB — TSH: TSH: 3.19 u[IU]/mL (ref 0.450–4.500)

## 2015-06-16 NOTE — Progress Notes (Signed)
Please call about overdue labs 

## 2015-06-19 ENCOUNTER — Emergency Department: Payer: Medicare Other

## 2015-06-19 ENCOUNTER — Encounter: Payer: Self-pay | Admitting: Emergency Medicine

## 2015-06-19 ENCOUNTER — Emergency Department
Admission: EM | Admit: 2015-06-19 | Discharge: 2015-06-19 | Disposition: A | Discharge status: Home / Self Care | Payer: Medicare Other | Attending: Emergency Medicine | Admitting: Emergency Medicine

## 2015-06-19 ENCOUNTER — Other Ambulatory Visit: Payer: Self-pay

## 2015-06-19 DIAGNOSIS — W1839XA Other fall on same level, initial encounter: Secondary | ICD-10-CM | POA: Diagnosis not present

## 2015-06-19 DIAGNOSIS — Y9389 Activity, other specified: Secondary | ICD-10-CM | POA: Insufficient documentation

## 2015-06-19 DIAGNOSIS — S3991XA Unspecified injury of abdomen, initial encounter: Secondary | ICD-10-CM | POA: Diagnosis not present

## 2015-06-19 DIAGNOSIS — Z79899 Other long term (current) drug therapy: Secondary | ICD-10-CM | POA: Diagnosis not present

## 2015-06-19 DIAGNOSIS — S299XXA Unspecified injury of thorax, initial encounter: Secondary | ICD-10-CM | POA: Insufficient documentation

## 2015-06-19 DIAGNOSIS — M546 Pain in thoracic spine: Secondary | ICD-10-CM

## 2015-06-19 DIAGNOSIS — Y998 Other external cause status: Secondary | ICD-10-CM | POA: Diagnosis not present

## 2015-06-19 DIAGNOSIS — Y9289 Other specified places as the place of occurrence of the external cause: Secondary | ICD-10-CM | POA: Insufficient documentation

## 2015-06-19 DIAGNOSIS — Z7982 Long term (current) use of aspirin: Secondary | ICD-10-CM | POA: Insufficient documentation

## 2015-06-19 HISTORY — DX: Cerebral infarction, unspecified: I63.9

## 2015-06-19 HISTORY — DX: Unspecified osteoarthritis, unspecified site: M19.90

## 2015-06-19 LAB — URINALYSIS COMPLETE WITH MICROSCOPIC (ARMC ONLY)
BACTERIA UA: NONE SEEN
BILIRUBIN URINE: NEGATIVE
Glucose, UA: NEGATIVE mg/dL
HGB URINE DIPSTICK: NEGATIVE
Ketones, ur: NEGATIVE mg/dL
Leukocytes, UA: NEGATIVE
Nitrite: NEGATIVE
PH: 6 (ref 5.0–8.0)
PROTEIN: NEGATIVE mg/dL
Specific Gravity, Urine: 1.019 (ref 1.005–1.030)
Squamous Epithelial / LPF: NONE SEEN

## 2015-06-19 LAB — LIPASE, BLOOD: LIPASE: 15 U/L — AB (ref 22–51)

## 2015-06-19 LAB — COMPREHENSIVE METABOLIC PANEL
ALBUMIN: 3.4 g/dL — AB (ref 3.5–5.0)
ALT: 8 U/L — ABNORMAL LOW (ref 14–54)
ANION GAP: 9 (ref 5–15)
AST: 21 U/L (ref 15–41)
Alkaline Phosphatase: 95 U/L (ref 38–126)
BUN: 21 mg/dL — ABNORMAL HIGH (ref 6–20)
CALCIUM: 9.2 mg/dL (ref 8.9–10.3)
CHLORIDE: 100 mmol/L — AB (ref 101–111)
CO2: 28 mmol/L (ref 22–32)
Creatinine, Ser: 0.95 mg/dL (ref 0.44–1.00)
GFR calc non Af Amer: 49 mL/min — ABNORMAL LOW (ref >60)
GFR, EST AFRICAN AMERICAN: 57 mL/min — AB (ref >60)
Glucose, Bld: 91 mg/dL (ref 65–99)
POTASSIUM: 4.2 mmol/L (ref 3.5–5.1)
SODIUM: 137 mmol/L (ref 135–145)
Total Bilirubin: 0.4 mg/dL (ref 0.3–1.2)
Total Protein: 6.6 g/dL (ref 6.5–8.1)

## 2015-06-19 LAB — CBC
HEMATOCRIT: 41 % (ref 35.0–47.0)
HEMOGLOBIN: 13.8 g/dL (ref 12.0–16.0)
MCH: 32.4 pg (ref 26.0–34.0)
MCHC: 33.6 g/dL (ref 32.0–36.0)
MCV: 96.5 fL (ref 80.0–100.0)
Platelets: 340 10*3/uL (ref 150–440)
RBC: 4.25 MIL/uL (ref 3.80–5.20)
RDW: 13.3 % (ref 11.5–14.5)
WBC: 10.1 10*3/uL (ref 3.6–11.0)

## 2015-06-19 LAB — TROPONIN I: Troponin I: <0.03 ng/mL (ref <0.031)

## 2015-06-19 MED ORDER — TRAMADOL HCL 50 MG PO TABS: 50.0000 mg | ORAL_TABLET | Freq: Four times a day (QID) | ORAL | Status: DC | PRN

## 2015-06-19 NOTE — Discharge Instructions (Signed)
Back Pain, Adult Low back pain is very common. About 1 in 5 people have back pain.The cause of low back pain is rarely dangerous. The pain often gets better over time.About half of people with a sudden onset of back pain feel better in just 2 weeks. About 8 in 10 people feel better by 6 weeks.  CAUSES Some common causes of back pain include:  Strain of the muscles or ligaments supporting the spine.  Wear and tear (degeneration) of the spinal discs.  Arthritis.  Direct injury to the back. DIAGNOSIS Most of the time, the direct cause of low back pain is not known.However, back pain can be treated effectively even when the exact cause of the pain is unknown.Answering your caregiver's questions about your overall health and symptoms is one of the most accurate ways to make sure the cause of your pain is not dangerous. If your caregiver needs more information, he or she may order lab work or imaging tests (X-rays or MRIs).However, even if imaging tests show changes in your back, this usually does not require surgery. HOME CARE INSTRUCTIONS For many people, back pain returns.Since low back pain is rarely dangerous, it is often a condition that people can learn to manageon their own.   Remain active. It is stressful on the back to sit or stand in one place. Do not sit, drive, or stand in one place for more than 30 minutes at a time. Take short walks on level surfaces as soon as pain allows.Try to increase the length of time you walk each day.  Do not stay in bed.Resting more than 1 or 2 days can delay your recovery.  Do not avoid exercise or work.Your body is made to move.It is not dangerous to be active, even though your back may hurt.Your back will likely heal faster if you return to being active before your pain is gone.  Pay attention to your body when you bend and lift. Many people have less discomfortwhen lifting if they bend their knees, keep the load close to their bodies,and  avoid twisting. Often, the most comfortable positions are those that put less stress on your recovering back.  Find a comfortable position to sleep. Use a firm mattress and lie on your side with your knees slightly bent. If you lie on your back, put a pillow under your knees.  Only take over-the-counter or prescription medicines as directed by your caregiver. Over-the-counter medicines to reduce pain and inflammation are often the most helpful.Your caregiver may prescribe muscle relaxant drugs.These medicines help dull your pain so you can more quickly return to your normal activities and healthy exercise.  Put ice on the injured area.  Put ice in a plastic bag.  Place a towel between your skin and the bag.  Leave the ice on for 15-20 minutes, 03-04 times a day for the first 2 to 3 days. After that, ice and heat may be alternated to reduce pain and spasms.  Ask your caregiver about trying back exercises and gentle massage. This may be of some benefit.  Avoid feeling anxious or stressed.Stress increases muscle tension and can worsen back pain.It is important to recognize when you are anxious or stressed and learn ways to manage it.Exercise is a great option. SEEK MEDICAL CARE IF:  You have pain that is not relieved with rest or medicine.  You have pain that does not improve in 1 week.  You have new symptoms.  You are generally not feeling well. SEEK   IMMEDIATE MEDICAL CARE IF:   You have pain that radiates from your back into your legs.  You develop new bowel or bladder control problems.  You have unusual weakness or numbness in your arms or legs.  You develop nausea or vomiting.  You develop abdominal pain.  You feel faint. Document Released: 10/15/2005 Document Revised: 04/15/2012 Document Reviewed: 02/16/2014 Exit[employer] Patient Information 2015 [employer]. This information is not intended to replace advice given to you by your health care provider. Make sure you  discuss any questions you have with your health care provider.  

## 2015-06-19 NOTE — ED Notes (Signed)
Discharge instructions discussed with Bonita Quin, patients daughter at bedside.  E-signature pad is not working at this time.

## 2015-06-19 NOTE — ED Notes (Signed)
Patient to ER from home for RUQ abdominal pain x few days. Patient sustained fall approx one week ago with no known injuries. Patient also c/o numbness to lower extremities last few days. Patient has visible deformity to left wrist, which patient states has been there a while and is not new. Patient has noted tenderness to RUQ upon moving from EMS stretcher to ER stretcher.

## 2015-06-19 NOTE — ED Provider Notes (Signed)
[facility] Emergency Department Provider Note  Time seen: 1:46 PM  I have reviewed the triage vital signs and the nursing notes.   HISTORY  Chief Complaint Abdominal Pain    HPI Dawn House is a 81 y.o. female with a past medical history of arthritis, CVA, presents the emergency department for back pain. According to the patient and her daughter she had a fall approximately one week ago out of bed. The daughter states at that time she was not complaining of much pain but for the past one week she has progressively complained more and more of back pain. Today the patient was also stating some upper abdominal pain, but the daughter and the patient state that resolved this morning. At one point today the patient complained of numbness to the right leg, but the daughter states that is also resolved, patient denies any numbness. Patient denies any nausea, vomiting, diarrhea. Denies any fever. Daughter denies as well. Currently describes her back pain is mild to moderate. Worse when I push on it.     Past Medical History  Diagnosis Date  . Arthritis   . Stroke     1995: TIA x2    Patient Active Problem List   Diagnosis Date Noted  . Osteoporosis 06/09/2015  . Anemia of chronic disease 06/09/2015  . Gait instability 06/09/2015  . Deformity of left wrist 06/09/2015  . DDD (degenerative disc disease), thoracolumbar 06/09/2015  . Mild cognitive impairment 06/09/2015  . Underweight 06/09/2015  . Vitamin B12 deficiency 06/09/2015  . Hard stool 06/09/2015  . Hx of transient ischemic attack (TIA) 06/09/2015  . Hx of fracture of hip 06/09/2015    Past Surgical History  Procedure Laterality Date  . Varicose vein surgery    . Abdominal hysterectomy    . Mastoidectomy    . Knee surgery    . Cataract extraction bilateral w/ anterior vitrectomy    . Neck dissection    . Fracture surgery      2 left wrist surgeries, 1 right wrist, 1 right hip  . Back surgery       C-spine fusion    Current Outpatient Rx  Name  Route  Sig  Dispense  Refill  . acetaminophen (TYLENOL) 325 MG tablet   Oral   Take 2 tablets by mouth every 6 (six) hours as needed.         Marland Kitchen aspirin 81 MG tablet   Oral   Take 81 mg by mouth daily.         Marland Kitchen docusate sodium (COLACE) 50 MG capsule   Oral   Take 1 capsule (50 mg total) by mouth daily as needed.   10 capsule   0   . vitamin B-12 (CYANOCOBALAMIN) 1000 MCG tablet   Oral   Take 1 tablet (1,000 mcg total) by mouth daily.   30 tablet   0     Allergies Hydrocodone-acetaminophen  Family History  Problem Relation Age of Onset  . Cancer Father     Social History Social History  Substance Use Topics  . Smoking status: Never Smoker   . Smokeless tobacco: None  . Alcohol Use: No    Review of Systems Constitutional: Negative for fever. Cardiovascular: Negative for chest pain. Respiratory: Negative for shortness of breath. Gastrointestinal: Positive for upper abdominal pain which is now resolved per patient. Genitourinary: Negative for dysuria. Musculoskeletal: Positive for mid upper back pain. Neurological: Negative for headaches, focal weakness or numbness.  10-point  ROS otherwise negative.  ____________________________________________   PHYSICAL EXAM:  VITAL SIGNS: ED Triage Vitals  Enc Vitals Group     BP 06/19/15 1307 168/86 mmHg     Pulse Rate 06/19/15 1307 76     Resp --      Temp 06/19/15 1307 98.3 F (36.8 C)     Temp Source 06/19/15 1307 Oral     SpO2 06/19/15 1303 95 %     Weight 06/19/15 1307 98 lb 15.8 oz (44.9 kg)     Height 06/19/15 1307  (1.702 m)     Head Cir --      Peak Flow --      Pain Score --      Pain Loc --      Pain Edu? --      Excl. in GC? --     Constitutional: Alert.  Well appearing and in no distress. Eyes: Normal exam ENT   Head: Normocephalic and atraumatic.   Mouth/Throat: Mucous membranes are moist. Cardiovascular: Normal rate,  regular rhythm. No murmur Respiratory: Normal respiratory effort without tachypnea nor retractions. Breath sounds are clear and equal bilaterally. No wheezes/rales/rhonchi. Moderate mid to lower thoracic spine tenderness especially left paraspinal region. Gastrointestinal: Soft, mild epigastric tenderness palpation, no rebound or guarding. No distention. Musculoskeletal: Nontender with normal range of motion in all extremities. No lower extremity tenderness or edema. Neurologic:  Normal speech and language. No gross focal neurologic deficits Skin:  Skin is warm, dry and intact.  Psychiatric: Mood and affect are normal. Speech and behavior are normal.   ____________________________________________    EKG  EKG reviewed and interpreted by myself shows a likely normal sinus rhythm at 75 bpm, narrow QRS, left axis deviation, nonspecific but no concerning ST changes noted.  ____________________________________________    RADIOLOGY  CT shows unchanged compression fractures. No acute fracture.  ____________________________________________   INITIAL IMPRESSION / ASSESSMENT AND PLAN / ED COURSE  Pertinent labs & imaging results that were available during my care of the patient were reviewed by me and considered in my medical decision making (see chart for details).  Patient with 1 week of progressive back pain following a fall. Patient also stated some right leg numbness which has resolved, as well as some epigastric pain which the patient states has resolved as well. Patient does have mild tenderness to palpation in the epigastrium. We will check labs, thoracic and lumbar x-rays, and closely monitor in the emergency department. Currently the patient appears overall very well.  CT shows unchanged fractures. We will discharge home on a short course of pain medication/Ultram. Patient and daughter are agreeable.  ____________________________________________   FINAL CLINICAL IMPRESSION(S) / ED  DIAGNOSES  Back pain   Minna Antis, MD 06/19/15 1559

## 2015-06-19 NOTE — ED Notes (Signed)
Patient transported to CT 

## 2015-06-20 DIAGNOSIS — S32020A Wedge compression fracture of second lumbar vertebra, initial encounter for closed fracture: Secondary | ICD-10-CM | POA: Insufficient documentation

## 2015-06-20 DIAGNOSIS — S22080A Wedge compression fracture of T11-T12 vertebra, initial encounter for closed fracture: Secondary | ICD-10-CM | POA: Insufficient documentation

## 2015-07-07 ENCOUNTER — Other Ambulatory Visit: Payer: Self-pay

## 2015-07-07 NOTE — Progress Notes (Signed)
Yes home health PT referral appropriate

## 2015-07-07 NOTE — Progress Notes (Signed)
Not discussed last visit per my note, can refer to PT for eval for lift chair if requested.

## 2015-07-07 NOTE — Progress Notes (Signed)
Called Dawn House (daughter)- doesn't want to "send mom to physical therapy because it is hard to transport her." However, she said that she wanted to bring her in to discuss home health with you because it's getting harder to "help her in the home." If you agree to home health, I'll have the front call her set up appt

## 2015-07-15 ENCOUNTER — Other Ambulatory Visit: Payer: Self-pay | Admitting: Family Medicine

## 2015-07-15 DIAGNOSIS — R2681 Unsteadiness on feet: Secondary | ICD-10-CM

## 2015-08-04 ENCOUNTER — Telehealth: Payer: Self-pay

## 2015-08-04 NOTE — Telephone Encounter (Signed)
Sent message to [facility] for Rx for lift chair

## 2015-08-04 NOTE — Telephone Encounter (Signed)
Please call PT to see if they feel patient would benefit from lift chair, don't see any notes in system.

## 2015-08-08 ENCOUNTER — Telehealth: Payer: Self-pay

## 2015-08-08 NOTE — Telephone Encounter (Signed)
Sent [facility] a message 

## 2015-08-08 NOTE — Telephone Encounter (Signed)
OK please remind me when in office tomorrow

## 2015-08-10 ENCOUNTER — Telehealth: Payer: Self-pay

## 2015-08-10 NOTE — Telephone Encounter (Signed)
Sent message to [facility] 

## 2015-08-10 NOTE — Telephone Encounter (Signed)
On your desk

## 2015-08-10 NOTE — Telephone Encounter (Signed)
Message sent to [facility] 

## 2015-10-04 ENCOUNTER — Ambulatory Visit: Payer: Medicare Other | Admitting: Occupational Therapy

## 2015-10-07 ENCOUNTER — Ambulatory Visit: Payer: Medicare Other | Attending: Physician Assistant | Admitting: Occupational Therapy

## 2015-10-07 ENCOUNTER — Encounter: Payer: Self-pay | Admitting: Occupational Therapy

## 2015-10-07 DIAGNOSIS — S62102K Fracture of unspecified carpal bone, left wrist, subsequent encounter for fracture with nonunion: Secondary | ICD-10-CM | POA: Insufficient documentation

## 2015-10-07 DIAGNOSIS — S52532S Colles' fracture of left radius, sequela: Secondary | ICD-10-CM

## 2015-10-07 DIAGNOSIS — M25532 Pain in left wrist: Secondary | ICD-10-CM | POA: Diagnosis present

## 2015-10-07 DIAGNOSIS — G8929 Other chronic pain: Secondary | ICD-10-CM | POA: Insufficient documentation

## 2015-10-07 NOTE — Patient Instructions (Signed)
WEARING SCHEDULE:  Wear splint at ALL times except for hygiene care Gradually increase wearing time of splint in 30 min increments during the day and gradually wear at night to sleep.   PURPOSE:  For comfort and better positioning of left wrist.  To keep wrist from moving/"flopping"  CARE OF SPLINT:  Keep away from pets and children  Clean prefab splint by hand washing and hang to dry as needed.  *Dry hand/arm completely before replacing splint.  PRECAUTIONS/POTENTIAL PROBLEMS: *If you notice or experience increased pain, swelling, numbness, or a lingering reddened area from the splint: Contact your therapist immediately by calling [phone]. Wear splint for comfort and better positioning.   If you have any medical concerns or signs of infection, please call your doctor immediately

## 2015-10-07 NOTE — Therapy (Signed)
[facility] PHYSICAL AND SPORTS MEDICINE [address], [place], [address] Phone: [phone]   Fax:  [phone]  Occupational Therapy Evaluation  Patient Details  Name: Dawn House MRN: [id number] Date of Birth: Feb 01, 1918 Referring Provider: [name], PA  Encounter Date: 10/07/2015      OT End of Session - 10/07/15 1124    Visit Number 1   Number of Visits --  Eval only   OT Start Time 1000   OT Stop Time 1048   OT Time Calculation (min) 48 min   Equipment Utilized During Treatment Prefab wrist cock up splnt left size xs   Activity Tolerance Patient tolerated treatment well   Behavior During Therapy [facility] for tasks assessed/performed      Past Medical History  Diagnosis Date  . Arthritis   . Stroke [facility])     1995: TIA x2    Past Surgical History  Procedure Laterality Date  . Varicose vein surgery    . Abdominal hysterectomy    . Mastoidectomy    . Knee surgery    . Cataract extraction bilateral w/ anterior vitrectomy    . Neck dissection    . Fracture surgery      2 left wrist surgeries, 1 right wrist, 1 right hip  . Back surgery      C-spine fusion    There were no vitals filed for this visit.  Visit Diagnosis:  Fracture of left wrist with nonunion - Plan: Ot plan of care cert/re-cert  Colles' fracture of left radius, sequela - Plan: Ot plan of care cert/re-cert  Chronic pain of left wrist - Plan: Ot plan of care cert/re-cert      Subjective Assessment - 10/07/15 1007    Subjective  Pt is accompanied by her daughter today whom states that she has had 2 fractures to L Distal radius/Ulna and resulting displaced communited colles fracture and presents today for splinting. Pt's daughter states that [name], Dawn House has recommended that pt f/u with [name] in [place]. Pt fracture occured "Jan 2015 or 2016" (per daughters report) after falling in bathroom at her daughters house.   Patient is accompained by:  Family member   Pertinent History Pt lives at her home and has caregivers during the day 7am-5pm, and daughter PRN assist and niece stays with her at noght. She has caregivers at all times.   Patient Stated Goals Decreased pain L wrist.    Currently in Pain? Yes   Pain Score 5    Pain Location Wrist   Pain Orientation Left   Pain Descriptors / Indicators Constant;Aching;Discomfort   Pain Type Chronic pain   Pain Onset More than a month ago   Pain Frequency Constant           [facility] OT Assessment - 10/07/15 0001    Assessment   Diagnosis L displaced Colles fracture; distal radius/ulna fractures   Referring Provider [name], PA   Onset Date 10/30/14   Assessment Pt is a 81y/o female whom sustained L wrist colles fracture/communited displaced fracture of distal radius in "January of 2015 or 2016" (per daughter report) after falling in her daughters bathroom. Her daughter reports that pt was placed in some sort of splint for comfort and has not worn anything on that wrist since the first night (Pt removed during the night and refused to reapply). She presents today for splinting 12-24 months after initial injury for better positioning.    Precautions  Precautions None;Fall   Balance Screen   Has the patient fallen in the past 6 months Yes   How many times? Daughter reports 4 falls in last 2 years.   Has the patient had a decrease in activity level because of a fear of falling?  No   Is the patient reluctant to leave their home because of a fear of falling?  No   Home  Environment   Family/patient expects to be discharged to: Private residence   Living Arrangements Other(Comment)  Pt lives with niece, has caregivers during day & daughters    ADL   ADL comments Pt gets assistance for all ADL's from family and caregivers. Pt has assist 24/7 per daughters report.   Mobility   Mobility Status Comments Pt ambulates only a few steps at a time with family/caregiver assist & manual w/c use  by family/caregivers for distance   Written Expression   Dominant Hand Right   Vision - History   Baseline Vision --  NT   Cognition   Overall Cognitive Status History of cognitive impairments - at baseline  Daughter present; Pt w/ h/o dementia/pleasantly confused   Coordination   Gross Motor Movements are Fluid and Coordinated No   Fine Motor Movements are Fluid and Coordinated No   Coordination Pt does not use Left hand for functional activity, no weight bearing ~1-2 years since fracture   Edema - Water Displacement (mL)   Left Hand --  None noted/WNL's   ROM / Strength   AROM / PROM / Strength AROM  Impaired. No funct use L hand x1 year since fracture   Hand Function   Left Hand Gross Grasp Impaired   Comment No functional use left hand since fx x1-2 yrs ago                  OT Treatments/Exercises (OP) - 10/07/15 0001    Splinting   Splinting Pt was fitted with prefabricated wrist cock-up splint left size xs. Pt's daughter was educated in splinting use, care and precautions. She was educated in gradually increasing splint use over time in 30-60min intervals for better positioning and comfort. Moleskin padding was added to first web space and ulna area as pt was noted to express difficulty tolerating wrist in semi neutral position after 1 year w/o support, splint or cast. Pt's L arm is grossly deviated ulnarly and pt's wrist "flops" significantly when getting assistance for ADL's and self care tasks. Pt's daughter elected pre-fab splint over custom splinting as she was concerned that pt may remove any and all splints at any time or not be able to tolerate support to this wirst after 1-2 years w/o.  Therapist was in agreement with this and suggested that pt may benefit from f/u w/ MD when daughter expressed that they were initially referred to Dr Amanda PeaGramig "And it never happened, we didn't ever get anything set up" Pt's daughte rverbalized understanding of gradual splint use left  and verbalized understanding of splint use, cre and precautions. A piece of stockinette was placed over top of splint as pt's daughter states that pt prefers this.               OT Education - 10/07/15 1123    Education provided Yes   Education Details Splint wear and care   Person(s) Educated Patient;Child(ren)   Methods Explanation;Demonstration;Verbal cues;Tactile cues;Handout   Comprehension Verbalized understanding;Verbal cues required          OT Short Term  Goals - 27-Oct-2015 1131    OT SHORT TERM GOAL #1   Title Pt/family I splint wear and care (eval only)   Baseline Req Min A for donning/doffing   Time 1   Period Weeks   Status New                  Plan - 10-27-2015 1125    Clinical Impression Statement Pt is a 81y/o female whom sustained L wrist colles fracture/communited displaced fracture of distal radius in "January of 2015 or 2016" after falling in her daughters bathroom. Her daughter reports that pt was placed in some sort of splint for comfort and has not worn anything on that wrist since the first night (Pt removed during the night and refused to reapply). She presents today for splinting 12-24 months after initial injury for better positioning. She should benefit from pre-fab splint use L wrist to assist with better positioning and comfort during ADL's and functional transfers (as assisted by caregivers).   Rehab Potential Fair   Clinical Impairments Affecting Rehab Potential Date of Injury x1-2 years ago left wrist; h/o multiple fractures left wrist    Plan Eval/splint only, no futher OT recommended at this time. Pt to wear left wrist splint for comfot and better positioning during (day & niight as tolerated) daily activity and functional transfers as assisted by family and caregivers.   Recommended Other Services Consider f/u w/ MD for further recommendations XB:JYNW wrist and positioning.   Consulted and Agree with Plan of Care Family member/caregiver    Family Member Consulted Pt's daughter           G-Codes - 10-27-15 1142    Functional Assessment Tool Used Clinical judgement   Functional Limitation Self care   Self Care Current Status [phone]) At least 80 percent but less than 100 percent impaired, limited or restricted   Self Care Goal Status (Z3086) At least 80 percent but less than 100 percent impaired, limited or restricted   Self Care Discharge Status [phone]) At least 80 percent but less than 100 percent impaired, limited or restricted      Problem List Patient Active Problem List   Diagnosis Date Noted  . Compression fracture of T12 vertebra (HCC) 06/20/2015  . Compression fracture of L2 lumbar vertebra (HCC) 06/20/2015  . Osteoporosis 06/09/2015  . Anemia of chronic disease 06/09/2015  . Gait instability 06/09/2015  . Deformity of left wrist 06/09/2015  . DDD (degenerative disc disease), thoracolumbar 06/09/2015  . Mild cognitive impairment 06/09/2015  . Underweight 06/09/2015  . Vitamin B12 deficiency 06/09/2015  . Hard stool 06/09/2015  . Hx of transient ischemic attack (TIA) 06/09/2015  . Hx of fracture of hip 06/09/2015    Mariam Dollar Dionicio Stall, OTR/L 10/27/2015, 11:48 AM  [facility] PHYSICAL AND SPORTS MEDICINE [address], [place], [address] Phone: [phone]   Fax:  [phone]  Name: LACHAE HOHLER MRN: [id number] Date of Birth: Nov 24, 1917

## 2015-12-14 ENCOUNTER — Telehealth: Payer: Self-pay

## 2015-12-14 NOTE — Telephone Encounter (Signed)
Patient having strong odor and family wanted to bring urine in to test. I advised OV.

## 2015-12-16 ENCOUNTER — Encounter: Payer: Self-pay | Admitting: Family Medicine

## 2015-12-16 ENCOUNTER — Ambulatory Visit (INDEPENDENT_AMBULATORY_CARE_PROVIDER_SITE_OTHER): Payer: Medicare Other | Admitting: Family Medicine

## 2015-12-16 VITALS — BP 100/72 | HR 72 | Temp 98.0°F | Resp 16 | Ht 66.0 in | Wt 86.2 lb

## 2015-12-16 DIAGNOSIS — G3184 Mild cognitive impairment, so stated: Secondary | ICD-10-CM

## 2015-12-16 DIAGNOSIS — Z8673 Personal history of transient ischemic attack (TIA), and cerebral infarction without residual deficits: Secondary | ICD-10-CM

## 2015-12-16 DIAGNOSIS — T83511A Infection and inflammatory reaction due to indwelling urethral catheter, initial encounter: Secondary | ICD-10-CM

## 2015-12-16 DIAGNOSIS — E538 Deficiency of other specified B group vitamins: Secondary | ICD-10-CM | POA: Diagnosis not present

## 2015-12-16 DIAGNOSIS — M21932 Unspecified acquired deformity of left forearm: Secondary | ICD-10-CM

## 2015-12-16 DIAGNOSIS — N390 Urinary tract infection, site not specified: Secondary | ICD-10-CM

## 2015-12-16 DIAGNOSIS — R634 Abnormal weight loss: Secondary | ICD-10-CM

## 2015-12-16 LAB — POCT URINALYSIS DIPSTICK
BILIRUBIN UA: NEGATIVE
Glucose, UA: NEGATIVE
KETONES UA: NEGATIVE
Leukocytes, UA: NEGATIVE
NITRITE UA: NEGATIVE
PH UA: 5
RBC UA: NEGATIVE
SPEC GRAV UA: 1.015
Urobilinogen, UA: 0.2

## 2015-12-16 MED ORDER — DICLOFENAC EPOLAMINE 1.3 % TD PTCH: 1.0000 | MEDICATED_PATCH | TRANSDERMAL | Status: AC

## 2015-12-16 NOTE — Progress Notes (Signed)
Date:  12/16/2015   Name:  Dawn House   DOB:  03-21-1918   MRN:  [id number]  PCP:  Schuyler Amor, MD    Chief Complaint: Nocturia   History of Present Illness:  This is a 82 y.o. female with urinary frequency. Last seen in August, has been seen by ortho and PT since for chronically displaced nonhealed L wrist fracture. Not a surgical candidate. The brace fitted by OT seems to help. Her daughter has also been applying a Flector patch she had been prescribed in past for her back to her wrist every 3 days which also seems to help. Appetite seems ok but losing weight (11# past 6 months), now down to BMI of 13.9. Taking asa and vitamin B12 supp daily but no other regular oral meds. Daughter who is HCPA in [place] this week. She is having increasing difficulty transferring.  Review of Systems:  Review of Systems  Constitutional: Negative for fever.  HENT: Negative for trouble swallowing.   Respiratory: Negative for shortness of breath.   Cardiovascular: Negative for leg swelling.  Gastrointestinal: Negative for abdominal pain.  Genitourinary: Negative for hematuria.  Neurological: Negative for syncope.    Patient Active Problem List   Diagnosis Date Noted  . Compression fracture of T12 vertebra (HCC) 06/20/2015  . Compression fracture of L2 lumbar vertebra (HCC) 06/20/2015  . Osteoporosis 06/09/2015  . Anemia of chronic disease 06/09/2015  . Gait instability 06/09/2015  . Deformity of left wrist 06/09/2015  . DDD (degenerative disc disease), thoracolumbar 06/09/2015  . Mild cognitive impairment 06/09/2015  . Underweight 06/09/2015  . Vitamin B12 deficiency 06/09/2015  . Hard stool 06/09/2015  . Hx of transient ischemic attack (TIA) 06/09/2015  . Hx of fracture of hip 06/09/2015    Prior to Admission medications   Medication Sig Start Date End Date Taking? Authorizing Provider  acetaminophen (TYLENOL) 325 MG tablet Take 2 tablets by mouth every 6 (six) hours as needed.   Yes  Historical Provider, MD  aspirin 81 MG tablet Take 81 mg by mouth daily.   Yes Historical Provider, MD  docusate sodium (COLACE) 50 MG capsule Take 1 capsule (50 mg total) by mouth daily as needed. 06/09/15  Yes Schuyler Amor, MD  vitamin B-12 (CYANOCOBALAMIN) 1000 MCG tablet Take 1 tablet (1,000 mcg total) by mouth daily. 06/09/15  Yes Schuyler Amor, MD  diclofenac (FLECTOR) 1.3 % PTCH Place 1 patch onto the skin every 3 (three) days. 12/16/15   Schuyler Amor, MD    Allergies  Allergen Reactions  . Hydrocodone Anxiety  . Hydrocodone-Acetaminophen Other (See Comments)    Past Surgical History  Procedure Laterality Date  . Varicose vein surgery    . Abdominal hysterectomy    . Mastoidectomy    . Knee surgery    . Cataract extraction bilateral w/ anterior vitrectomy    . Neck dissection    . Fracture surgery      2 left wrist surgeries, 1 right wrist, 1 right hip  . Back surgery      C-spine fusion    Social History  Substance Use Topics  . Smoking status: Never Smoker   . Smokeless tobacco: None  . Alcohol Use: No    Family History  Problem Relation Age of Onset  . Cancer Father     Medication list has been reviewed and updated.  Physical Examination: BP 100/72 mmHg  Pulse 72  Temp(Src) 98 F (36.7 C) (Oral)  Resp 16  Ht  (  1.676 m)  Wt 86 lb 3.2 oz (39.1 kg)  BMI 13.92 kg/m2  SpO2 100%  Physical Exam  Constitutional: She appears well-developed.  Cachectic appearing  Cardiovascular: Normal rate, regular rhythm and normal heart sounds.   Pulmonary/Chest: Effort normal and breath sounds normal.  Musculoskeletal: She exhibits no edema.  Neurological: She is alert.  Skin: Skin is warm and dry.  Psychiatric: She has a normal mood and affect. Her behavior is normal.  Nursing note and vitals reviewed.   Assessment and Plan:  1. Weight loss Unclear etiology, likely irreversible. Recommend hospice referral, daughter will discuss with HCPA.  2. Deformity of  left wrist Pain improved with splint, Flector patch, refill Flector q3d  3. Vitamin B12 deficiency On supplement, continue  4. Mild cognitive impairment Stable per family  5. Hx of transient ischemic attack (TIA) Continue asa  6. Urinary tract infection associated with catheterization of urinary tract, initial encounter UA negative, no evidence of UTI (unable to remove dx) - POCT urinalysis dipstick  Return if symptoms worsen or fail to improve.  Dionne Ano. Kingsley Spittle MD [facility]  12/16/2015

## 2015-12-19 ENCOUNTER — Telehealth: Payer: Self-pay

## 2015-12-19 NOTE — Telephone Encounter (Signed)
Sent to [facility] 

## 2015-12-20 NOTE — Telephone Encounter (Signed)
Dr. Hollace Hayward,  Patient daughter called back again today. States that she was told to return your call and would like for you to call her back regarding Hospice Care for her mother. She states that she needs to know what to do. Please Advise. Thanks Richard Miu CB # [phone]

## 2015-12-21 ENCOUNTER — Other Ambulatory Visit: Payer: Self-pay | Admitting: Family Medicine

## 2015-12-21 DIAGNOSIS — R634 Abnormal weight loss: Secondary | ICD-10-CM

## 2015-12-21 NOTE — Telephone Encounter (Signed)
Spoke with daughter/HCPOA Bonita Quin, she agrees to hospice referral. Referral sent.

## 2016-02-17 ENCOUNTER — Telehealth: Payer: Self-pay

## 2016-02-17 DIAGNOSIS — L0390 Cellulitis, unspecified: Principal | ICD-10-CM

## 2016-02-17 DIAGNOSIS — L0291 Cutaneous abscess, unspecified: Secondary | ICD-10-CM

## 2016-02-17 MED ORDER — SULFAMETHOXAZOLE-TRIMETHOPRIM 800-160 MG/20ML PO SUSP: 20.0000 mL | Freq: Two times a day (BID) | ORAL | Status: AC

## 2016-02-17 NOTE — Telephone Encounter (Signed)
[facility] nurse called to report 1/2 cm x 1/2 cm wound draining on L Wrist with abcess and advised [facility] call in antibiotic. I called and could not reach so I called daughter Dawn House. She said there is redness and heat and pt in pain. Dawn House has agreed to call in liquid antibiotic. I advised to keep clean as instructed by Hospice who returns Monday.I also instructed to keep eye on redness getting larger and fever and contact Hospice Line or go to ER if worsens.

## 2016-02-20 ENCOUNTER — Telehealth: Payer: Self-pay

## 2016-02-20 NOTE — Telephone Encounter (Signed)
Sent to [facility] 

## 2016-02-20 NOTE — Telephone Encounter (Signed)
Called triage nurse, issue addressed Friday, have asked hospice medical director to assume patient's care as she is no longer coming to the office.

## 2016-03-29 DEATH — deceased

## 2017-06-05 IMAGING — CR DG THORACIC SPINE 2V
1 series · 2 of 2 positions shown · non-contrast
Comparison: Chest CT, 06/09/2014.

CLINICAL DATA: Patient to ER from home for RUQ abdominal pain x few
days. Patient sustained fall approx one week ago with no known
days. Patient has visible deformity to left wrist, which patient
states has been there a while and is not new. Patient has noted
tenderness to RUQ upon moving from EMS stretcher to ER stretcher.

EXAM:
THORACIC SPINE 2 VIEWS

[Series 1: dg thoracic spine 2 view · 0.14mm/px · 2 of 2 slices shown]
[im 1/2]
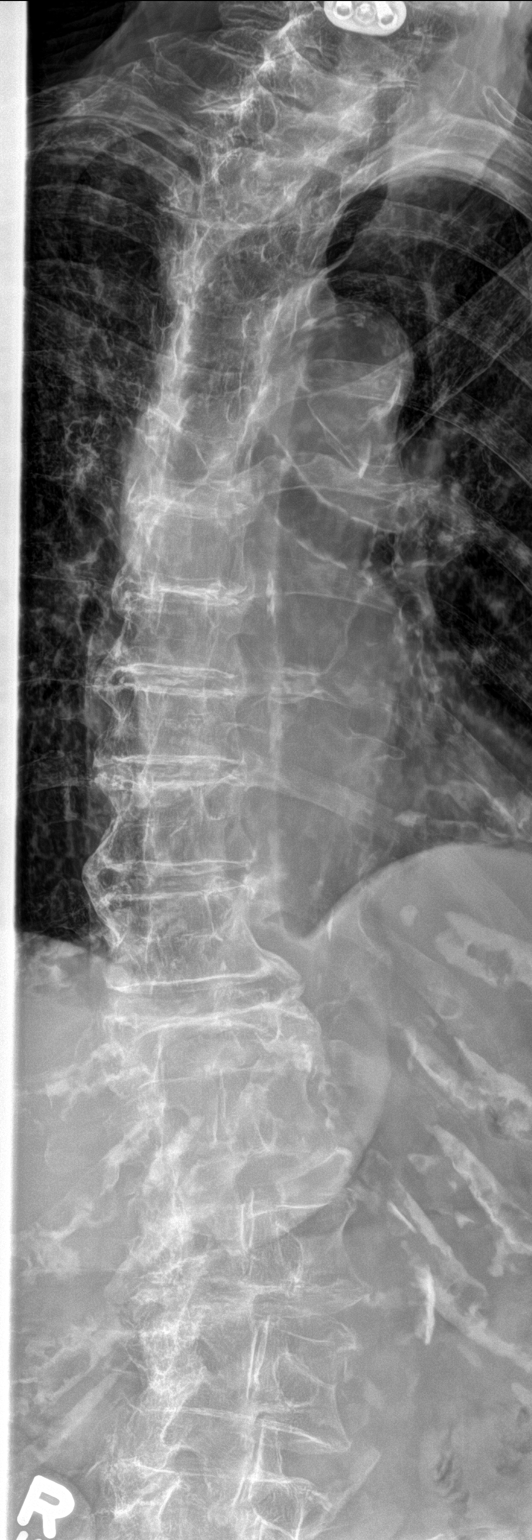
[im 2/2]
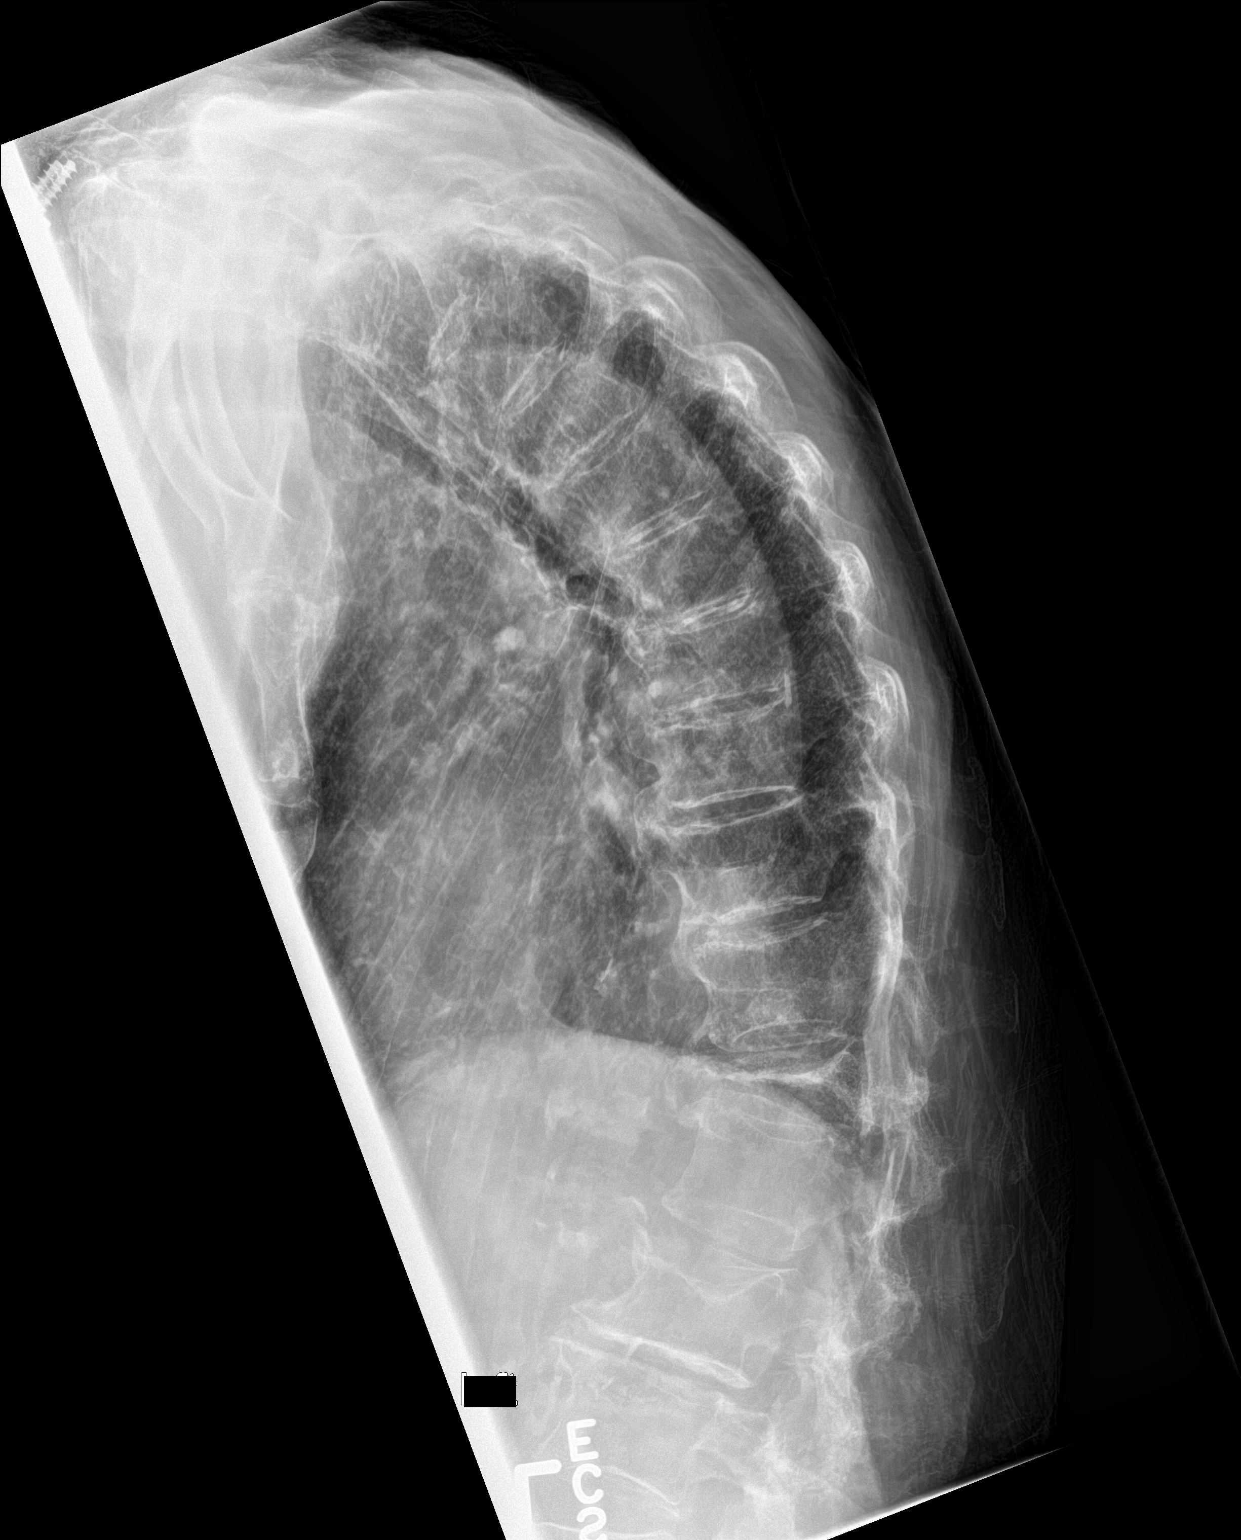

[2 of 2 positions shown; findings below may reference images not displayed]

FINDINGS: There are several vertebral fractures.

There is a severe compression fracture of T12, not fully imaged on
the prior chest CT, of unclear chronicity. There is a mild
compression fracture T3, stable. There is depression of the upper
endplate of L2 leading to mild loss of the L2 vertebral height, also
of unclear chronicity.

No other fractures. Bones are diffusely demineralized. There is
degenerative spurring throughout the thoracic spine as well as mild
loss of disc height, most evident in the mid thoracic spine

Soft tissues are unremarkable.
IMPRESSION: 1. Multiple vertebral fractures, with a severe fracture noted of
T12. This fracture was not fully visualized on the prior chest CT.
Its chronicity is unclear.
2. Mild compression fracture of L2 also of unclear chronicity.
Mild compression fracture of T3, old.
3. No other fractures. Diffuse bone demineralization and stable
thoracic spine disc degenerative changes.

## 2017-06-05 IMAGING — CT CT T SPINE W/O CM
1 series · 12 of 14 positions shown, 15 images · non-contrast
Comparison: Plain film 08/14/2010, 06/01/2014, chest CT 06/09/2014,
plain film 06/19/2015 MRI 08/14/2014

CLINICAL DATA: [AGE] female with a history of fall. Right
upper quadrant tenderness.

EXAM:
CT THORACIC SPINE WITHOUT CONTRAST
TECHNIQUE: Multidetector CT imaging of the thoracic spine was performed without
intravenous contrast administration. Multiplanar CT image
reconstructions were also generated.

[Series 4: t spine soft · axial · 0.32mm/px · z∈[-640,-382]mm · 12 of 153 slices shown, 15 images]
[im 12/153  soft-tissue]
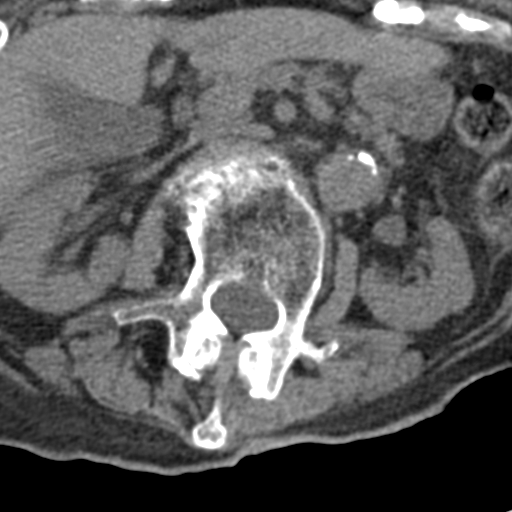
[im 12/153  bone]
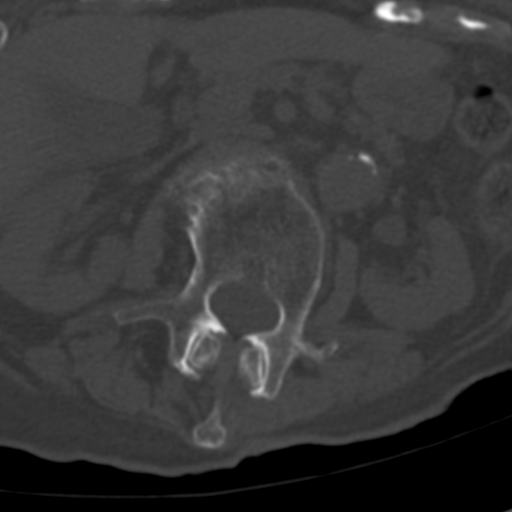
[im 24/153  bone]
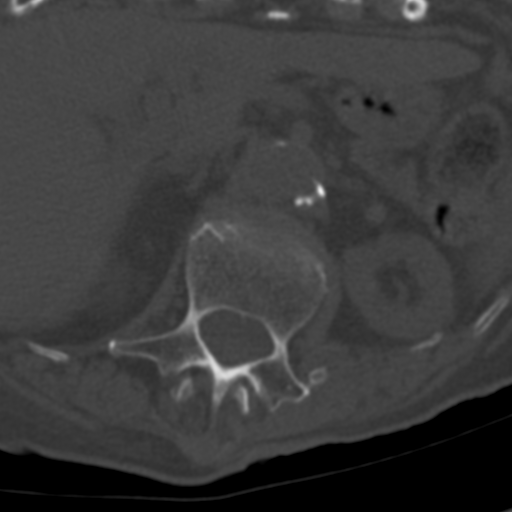
[im 36/153  bone]
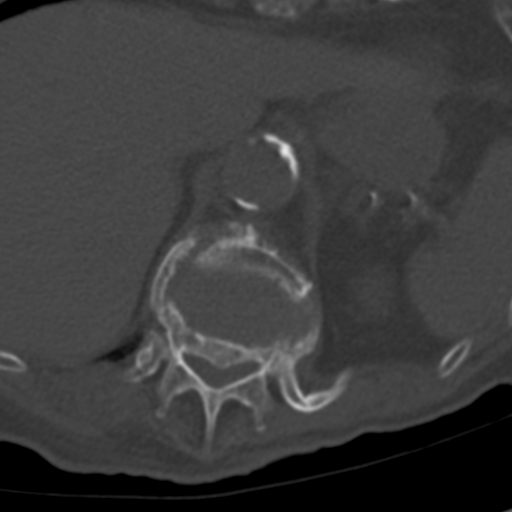
[im 47/153  bone]
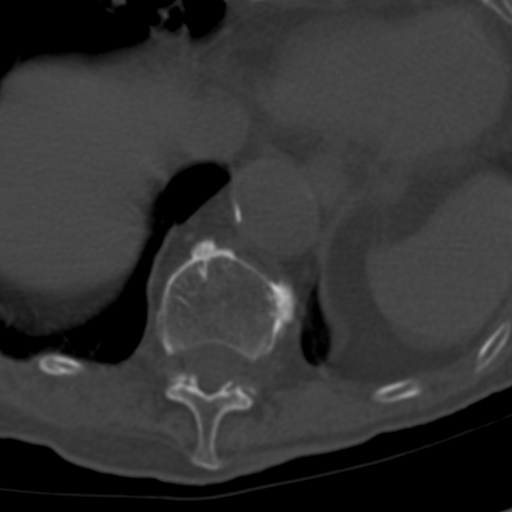
[im 59/153  soft-tissue]
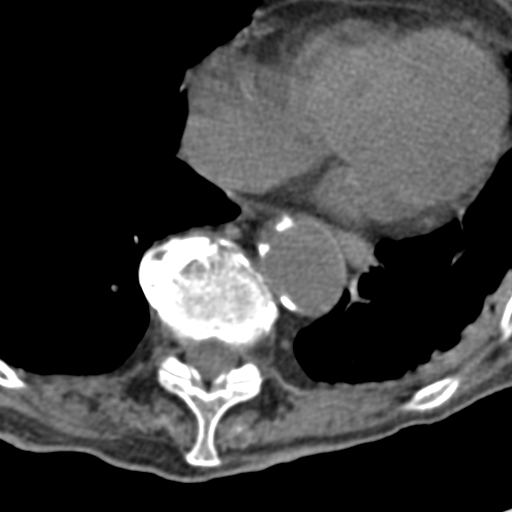
[im 59/153  bone]
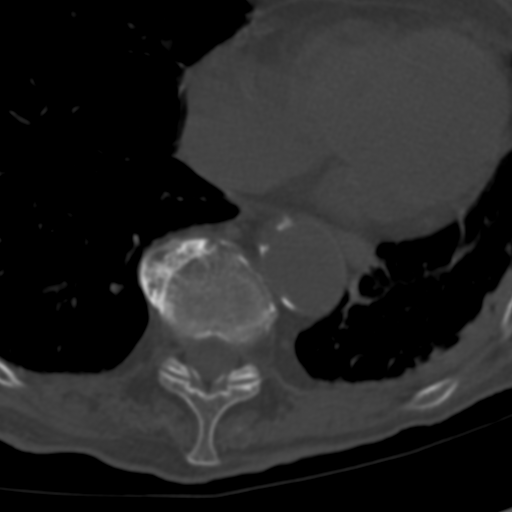
[im 71/153  bone]
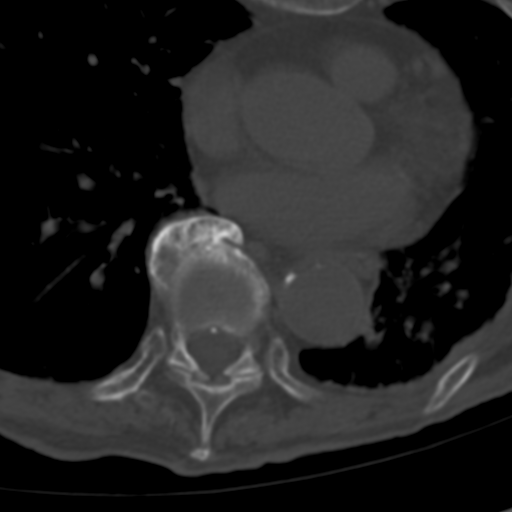
[im 82/153  bone]
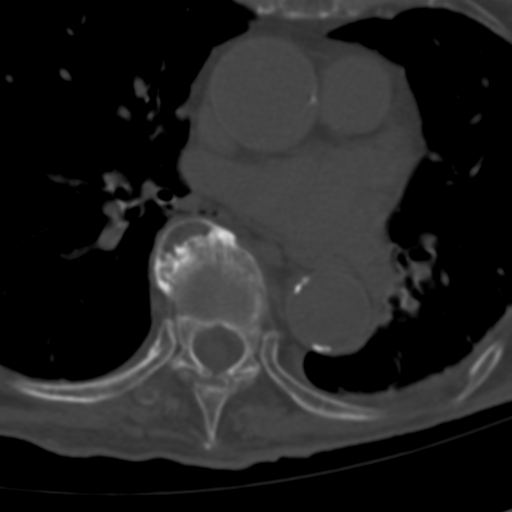
[im 94/153  bone]
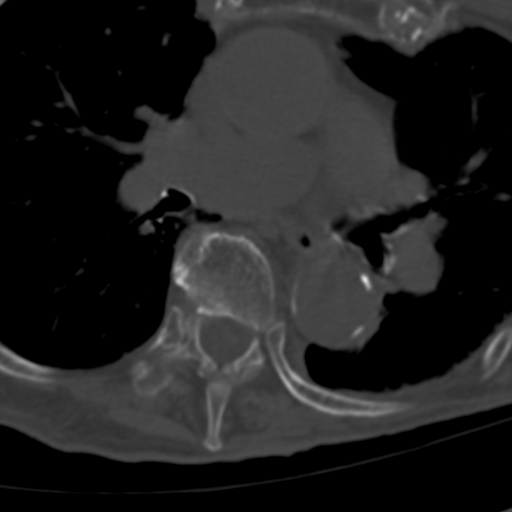
[im 106/153  soft-tissue]
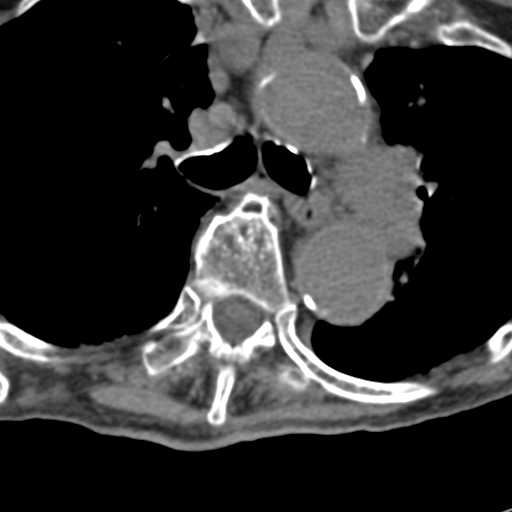
[im 106/153  bone]
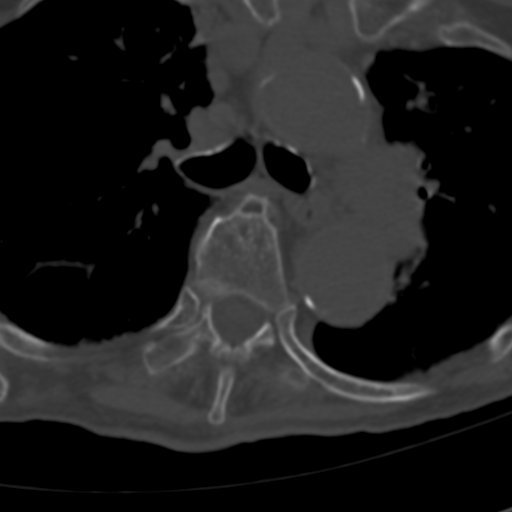
[im 117/153  bone]
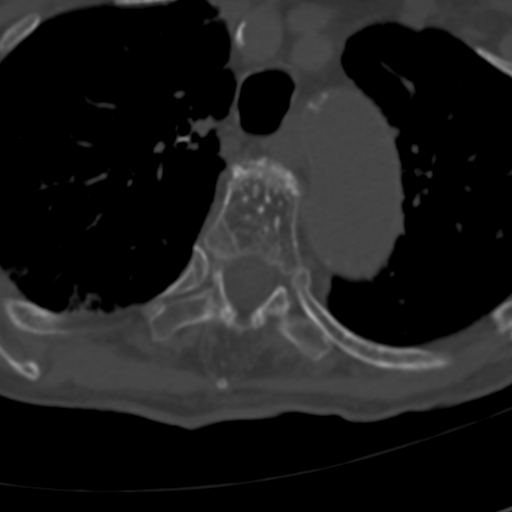
[im 129/153  bone]
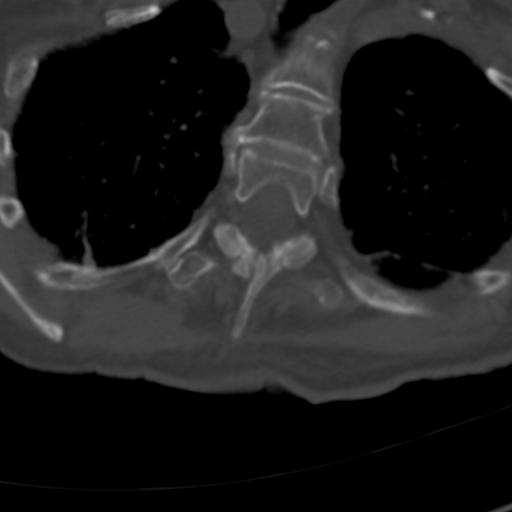
[im 141/153  bone]
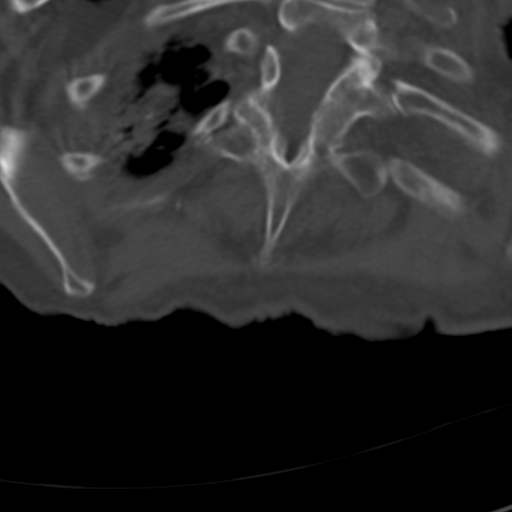

[12 of 14 positions shown; findings below may reference images not displayed]

FINDINGS: Thoracic spine:

Accentuated kyphotic curvature, more pronounced than the CT chest
06/09/2014.

No subluxation.

Anatomic alignment relatively maintained.

Unchanged configuration of T12 compression fracture, essentially
vertebral plana, with retropulsion of the posterior endplate and 50%
narrowing of the spinal canal this level. These findings are not
changed compared to the MRI of 08/09/2014.

No fracture line identified, though osteopenia limits detection for
nondisplaced fractures.

Vertebral body heights are maintained with the exception of T12
which is chronic.

Configuration of the L2 vertebral body is unchanged from the
comparison of 08/14/2014, with no progression of height loss.

Multilevel degenerative disc disease.

Hemangioma of T5.

Chronic deformity of the posterior ribs of 6-12 bilaterally at the
costovertebral junction.

Airspace disease/scarring at the lung apices similar to the CT dated
04/09/2014. Interstitial opacities bilateral lungs.

Atherosclerotic changes of the visualized vasculature.
IMPRESSION: No acute fracture of the thoracic spine identified.

Compression fracture of T12 with retropulsed posterior endplate is
unchanged from the MRI lumbar spine dated 08/14/2014, and the L2
compression fracture is also unchanged from this study.

Multilevel degenerative disc disease.

Chronic deformity of multiple posterior ribs, similar to comparison
CT studies.

Atherosclerosis.
